# Patient Record
Sex: Female | Born: 1980
Health system: Southern US, Community
[De-identification: ages and names within clinical notes are randomized; demographics above are authoritative.]

## PROBLEM LIST (undated history)

## (undated) DIAGNOSIS — I10 Essential (primary) hypertension: Secondary | ICD-10-CM

## (undated) DIAGNOSIS — R87629 Unspecified abnormal cytological findings in specimens from vagina: Secondary | ICD-10-CM

## (undated) DIAGNOSIS — J45909 Unspecified asthma, uncomplicated: Secondary | ICD-10-CM

## (undated) DIAGNOSIS — D649 Anemia, unspecified: Secondary | ICD-10-CM

## (undated) DIAGNOSIS — D219 Benign neoplasm of connective and other soft tissue, unspecified: Secondary | ICD-10-CM

## (undated) HISTORY — PX: CERVICAL CONE BIOPSY: SUR198

## (undated) HISTORY — PX: TUBAL LIGATION: SHX77

## (undated) HISTORY — PX: CHOLECYSTECTOMY: SHX55

---

## 2014-11-27 ENCOUNTER — Encounter (HOSPITAL_COMMUNITY): Payer: Self-pay | Admitting: *Deleted

## 2014-11-27 ENCOUNTER — Inpatient Hospital Stay (HOSPITAL_COMMUNITY)
Admission: AD | Admit: 2014-11-27 | Discharge: 2014-11-27 | Disposition: A | Discharge status: Home / Self Care | Payer: Medicaid Other | Source: Ambulatory Visit | Attending: Obstetrics and Gynecology | Admitting: Obstetrics and Gynecology

## 2014-11-27 DIAGNOSIS — N920 Excessive and frequent menstruation with regular cycle: Secondary | ICD-10-CM

## 2014-11-27 DIAGNOSIS — Z3202 Encounter for pregnancy test, result negative: Secondary | ICD-10-CM | POA: Diagnosis not present

## 2014-11-27 DIAGNOSIS — N946 Dysmenorrhea, unspecified: Secondary | ICD-10-CM

## 2014-11-27 DIAGNOSIS — N939 Abnormal uterine and vaginal bleeding, unspecified: Secondary | ICD-10-CM | POA: Diagnosis present

## 2014-11-27 DIAGNOSIS — Z87891 Personal history of nicotine dependence: Secondary | ICD-10-CM | POA: Insufficient documentation

## 2014-11-27 HISTORY — DX: Benign neoplasm of connective and other soft tissue, unspecified: D21.9

## 2014-11-27 HISTORY — DX: Essential (primary) hypertension: I10

## 2014-11-27 HISTORY — DX: Unspecified abnormal cytological findings in specimens from vagina: R87.629

## 2014-11-27 HISTORY — DX: Unspecified asthma, uncomplicated: J45.909

## 2014-11-27 LAB — URINE MICROSCOPIC-ADD ON

## 2014-11-27 LAB — URINALYSIS, ROUTINE W REFLEX MICROSCOPIC
BILIRUBIN URINE: NEGATIVE
GLUCOSE, UA: NEGATIVE mg/dL
Ketones, ur: NEGATIVE mg/dL
Leukocytes, UA: NEGATIVE
Nitrite: NEGATIVE
PH: 7 (ref 5.0–8.0)
Protein, ur: 30 mg/dL — AB
SPECIFIC GRAVITY, URINE: 1.015 (ref 1.005–1.030)

## 2014-11-27 LAB — CBC
HEMATOCRIT: 27.1 % — AB (ref 36.0–46.0)
Hemoglobin: 8.2 g/dL — ABNORMAL LOW (ref 12.0–15.0)
MCH: 21 pg — AB (ref 26.0–34.0)
MCHC: 30.3 g/dL (ref 30.0–36.0)
MCV: 69.5 fL — AB (ref 78.0–100.0)
PLATELETS: 380 10*3/uL (ref 150–400)
RBC: 3.9 MIL/uL (ref 3.87–5.11)
RDW: 20 % — AB (ref 11.5–15.5)
WBC: 7.2 10*3/uL (ref 4.0–10.5)

## 2014-11-27 LAB — POCT PREGNANCY, URINE: Preg Test, Ur: NEGATIVE

## 2014-11-27 NOTE — MAU Note (Addendum)
Went to the Dr, he put her on BC,started it the first day of her period, first 3 days were normal.  Now has been bleeding for 8 days, 5 have been heavy , has been nauseated.  Got on birth control to the bleeding and pain. Now it is 10 ties worser.

## 2014-11-27 NOTE — MAU Provider Note (Signed)
History     CSN: [id number]  Arrival date and time: 11/27/14 1254   First Provider Initiated Contact with Patient 11/27/14 1355      Chief Complaint  Patient presents with  . Abdominal Pain  . Vaginal Bleeding   HPI Ms. Colleen Pham is a 34 y.o. [phone] who presents to MAU today with complaint of vaginal bleeding. Patient states that she was recently seen by Dr. Deatra Ina for management of heavy painful regular periods. She was put on OCPs. She started the OCPs on the first day of her cycle, she did not take her OCPs or BP meds today. She states that she has not been bleeding x 8 days. She continues to have moderate to heavy bleeding today with severe cramps. She states that she has used up to 6 pads and/or tampons today. She endorses nausea and headache as well. She took Ibuprofen recently with little relief. She is taking Iron Supplements. She has known fibroids.    OB History    Gravida Para Term Preterm AB TAB SAB Ectopic Multiple Living   3 3 2 1      3       Past Medical History  Diagnosis Date  . Hypertension   . Fibroid   . Asthma   . Vaginal Pap smear, abnormal     Past Surgical History  Procedure Laterality Date  . Cholecystectomy    . Cervical cone biopsy      History reviewed. No pertinent family history.  Social History  Substance Use Topics  . Smoking status: Former Research scientist (life sciences)  . Smokeless tobacco: None  . Alcohol Use: No    Allergies: No Known Allergies  Prescriptions prior to admission  Medication Sig Dispense Refill Last Dose  . albuterol (PROVENTIL HFA;VENTOLIN HFA) 108 (90 BASE) MCG/ACT inhaler Inhale 1-2 puffs into the lungs every 6 (six) hours as needed for wheezing or shortness of breath.   Past Week at Unknown time  . ibuprofen (ADVIL,MOTRIN) 200 MG tablet Take 200 mg by mouth every 6 (six) hours as needed for moderate pain.   11/27/2014 at Unknown time  . LINZESS 145 MCG CAPS capsule Take 145 mcg by mouth daily.  2 Past Week at Unknown time  .  lisinopril-hydrochlorothiazide (PRINZIDE,ZESTORETIC) 10-12.5 MG tablet Take 1 tablet by mouth daily.  5 11/26/2014 at Unknown time  . naproxen sodium (ALEVE) 220 MG tablet Take 440 mg by mouth daily as needed.   Past Week at Unknown time  . Olopatadine HCl (PAZEO) 0.7 % SOLN Apply 1 drop to eye daily.   Past Week at Unknown time    Review of Systems  Constitutional: Negative for fever and malaise/fatigue.  Gastrointestinal: Positive for nausea and abdominal pain. Negative for vomiting, diarrhea and constipation.  Genitourinary: Negative for dysuria, urgency and frequency.       + vaginal bleeding  Neurological: Positive for headaches.   Physical Exam   Blood pressure 143/92, pulse 88, temperature 98.2 F (36.8 C), temperature source Oral, resp. rate 20, height 5\' 2"  (1.575 m), weight 246 lb 3.2 oz (111.676 kg), last menstrual period 11/19/2014.  Physical Exam  Nursing note and vitals reviewed. Constitutional: She is oriented to person, place, and time. She appears well-developed and well-nourished. No distress.  HENT:  Head: Normocephalic and atraumatic.  Cardiovascular: Normal rate.   Respiratory: Effort normal.  GI: Soft. She exhibits no distension and no mass. There is no tenderness. There is no rebound and no guarding.  Genitourinary: Uterus is enlarged.  Uterus is not tender. Cervix exhibits no motion tenderness, no discharge and no friability. Right adnexum displays no mass and no tenderness. Left adnexum displays no mass and no tenderness. There is bleeding (small amount of blood in the vaginal vault) in the vagina. No vaginal discharge found.  Neurological: She is alert and oriented to person, place, and time.  Skin: Skin is warm and dry. No erythema.  Psychiatric: She has a normal mood and affect.    Results for orders placed or performed during the hospital encounter of 11/27/14 (from the past 24 hour(s))  Urinalysis, Routine w reflex microscopic (not at [facility])     Status:  Abnormal   Collection Time: 11/27/14  1:20 PM  Result Value Ref Range   Color, Urine YELLOW YELLOW   APPearance HAZY (A) CLEAR   Specific Gravity, Urine 1.015 1.005 - 1.030   pH 7.0 5.0 - 8.0   Glucose, UA NEGATIVE NEGATIVE mg/dL   Hgb urine dipstick LARGE (A) NEGATIVE   Bilirubin Urine NEGATIVE NEGATIVE   Ketones, ur NEGATIVE NEGATIVE mg/dL   Protein, ur 30 (A) NEGATIVE mg/dL   Nitrite NEGATIVE NEGATIVE   Leukocytes, UA NEGATIVE NEGATIVE  Urine microscopic-add on     Status: Abnormal   Collection Time: 11/27/14  1:20 PM  Result Value Ref Range   Squamous Epithelial / LPF 0-5 (A) NONE SEEN   WBC, UA 0-5 0 - 5 WBC/hpf   RBC / HPF TOO NUMEROUS TO COUNT 0 - 5 RBC/hpf   Bacteria, UA RARE (A) NONE SEEN  Pregnancy, urine POC     Status: None   Collection Time: 11/27/14  1:26 PM  Result Value Ref Range   Preg Test, Ur NEGATIVE NEGATIVE  CBC     Status: Abnormal   Collection Time: 11/27/14  1:35 PM  Result Value Ref Range   WBC 7.2 4.0 - 10.5 K/uL   RBC 3.90 3.87 - 5.11 MIL/uL   Hemoglobin 8.2 (L) 12.0 - 15.0 g/dL   HCT 27.1 (L) 36.0 - 46.0 %   MCV 69.5 (L) 78.0 - 100.0 fL   MCH 21.0 (L) 26.0 - 34.0 pg   MCHC 30.3 30.0 - 36.0 g/dL   RDW 20.0 (H) 11.5 - 15.5 %   Platelets 380 150 - 400 K/uL    MAU Course  Procedures None  MDM UPT - negative UA, CBC today  Discussed with Dr. Rogue Bussing. Continue OCPs, NSAIDs 800 mg q 8 hours scheduled and make appointment with Deatra Ina if continues Discussed at length with patient the plan for continuing OCPs with NSAIDs and follow-up in the office if bleeding continues. Patient eventually voiced understanding, but also stated that she did not want to keep taking the pills if they were going to make her bleed like this. I advised her that discontinuing OCPs would most likely prolong her bleeding and she should continue them at this time. Also encouraged her to take BP medications regularly while on OCPs to avoid elevated blood pressure. Assessment  and Plan  A: Menorrhagia Dysmenorrhea  P: Discharge home Continue OCPs, NSAIDs and Iron supplements Bleeding precautions discussed Patient advised to follow-up with Dr. Deatra Ina as scheduled or sooner if symptoms persist or worsen Patient may return to MAU as needed or if her condition were to change or worsen  Luvenia Redden, PA-C  11/27/2014, 2:04 PM

## 2014-11-27 NOTE — Discharge Instructions (Signed)
Dysmenorrhea °Menstrual cramps (dysmenorrhea) are caused by the muscles of the uterus tightening (contracting) during a menstrual period. For some women, this discomfort is merely bothersome. For others, dysmenorrhea can be severe enough to interfere with everyday activities for a few days each month. °Primary dysmenorrhea is menstrual cramps that last a couple of days when you start having menstrual periods or soon after. This often begins after a teenager starts having her period. As a woman gets older or has a baby, the cramps will usually lessen or disappear. Secondary dysmenorrhea begins later in life, lasts longer, and the pain may be stronger than primary dysmenorrhea. The pain may start before the period and last a few days after the period.  °CAUSES  °Dysmenorrhea is usually caused by an underlying problem, such as: °· The tissue lining the uterus grows outside of the uterus in other areas of the body (endometriosis). °· The endometrial tissue, which normally lines the uterus, is found in or grows into the muscular walls of the uterus (adenomyosis). °· The pelvic blood vessels are engorged with blood just before the menstrual period (pelvic congestive syndrome). °· Overgrowth of cells (polyps) in the lining of the uterus or cervix. °· Falling down of the uterus (prolapse) because of loose or stretched ligaments. °· Depression. °· Bladder problems, infection, or inflammation. °· Problems with the intestine, a tumor, or irritable bowel syndrome. °· Cancer of the female organs or bladder. °· A severely tipped uterus. °· A very tight opening or closed cervix. °· Noncancerous tumors of the uterus (fibroids). °· Pelvic inflammatory disease (PID). °· Pelvic scarring (adhesions) from a previous surgery. °· Ovarian cyst. °· An intrauterine device (IUD) used for birth control. °RISK FACTORS °You may be at greater risk of dysmenorrhea if: °· You are younger than age 30. °· You started puberty early. °· You have  irregular or heavy bleeding. °· You have never given birth. °· You have a family history of this problem. °· You are a smoker. °SIGNS AND SYMPTOMS  °· Cramping or throbbing pain in your lower abdomen. °· Headaches. °· Lower back pain. °· Nausea or vomiting. °· Diarrhea. °· Sweating or dizziness. °· Loose stools. °DIAGNOSIS  °A diagnosis is based on your history, symptoms, physical exam, diagnostic tests, or procedures. Diagnostic tests or procedures may include: °· Blood tests. °· Ultrasonography. °· An examination of the lining of the uterus (dilation and curettage, D&C). °· An examination inside your abdomen or pelvis with a scope (laparoscopy). °· X-rays. °· CT scan. °· MRI. °· An examination inside the bladder with a scope (cystoscopy). °· An examination inside the intestine or stomach with a scope (colonoscopy, gastroscopy). °TREATMENT  °Treatment depends on the cause of the dysmenorrhea. Treatment may include: °· Pain medicine prescribed by your health care provider. °· Birth control pills or an IUD with progesterone hormone in it. °· Hormone replacement therapy. °· Nonsteroidal anti-inflammatory drugs (NSAIDs). These may help stop the production of prostaglandins. °· Surgery to remove adhesions, endometriosis, ovarian cyst, or fibroids. °· Removal of the uterus (hysterectomy). °· Progesterone shots to stop the menstrual period. °· Cutting the nerves on the sacrum that go to the female organs (presacral neurectomy). °· Electric current to the sacral nerves (sacral nerve stimulation). °· Antidepressant medicine. °· Psychiatric therapy, counseling, or group therapy. °· Exercise and physical therapy. °· Meditation and yoga therapy. °· Acupuncture. °HOME CARE INSTRUCTIONS  °· Only take over-the-counter or prescription medicines as directed by your health care provider. °· Place a heating pad   or hot water bottle on your lower back or abdomen. Do not sleep with the heating pad.  Use aerobic exercises, walking,  swimming, biking, and other exercises to help lessen the cramping.  Massage to the lower back or abdomen may help.  Stop smoking.  Avoid alcohol and caffeine. SEEK MEDICAL CARE IF:   Your pain does not get better with medicine.  You have pain with sexual intercourse.  Your pain increases and is not controlled with medicines.  You have abnormal vaginal bleeding with your period.  You develop nausea or vomiting with your period that is not controlled with medicine. SEEK IMMEDIATE MEDICAL CARE IF:  You pass out.    This information is not intended to replace advice given to you by your health care provider. Make sure you discuss any questions you have with your health care provider.   Document Released: 12/26/2004 Document Revised: 08/28/2012 Document Reviewed: 06/13/2012 Elsevier Interactive Patient Education 2016 [employer] Menorrhagia Menorrhagia is when your menstrual periods are heavy or last longer than usual.  HOME CARE  Only take medicine as told by your doctor.  Take any iron pills as told by your doctor. Heavy bleeding may cause low levels of iron in your body.  Do not take aspirin 1 week before or during your period. Aspirin can make the bleeding worse.  Lie down for a while if you change your tampon or pad more than once in 2 hours. This may help lessen the bleeding.  Eat a healthy diet and foods with iron. These foods include leafy green vegetables, meat, liver, eggs, and whole grain breads and cereals.  Do not try to lose weight. Wait until the heavy bleeding has stopped and your iron level is normal. GET HELP IF:  You soak through a pad or tampon every 1 or 2 hours, and this happens every time you have a period.  You need to use pads and tampons at the same time because you are bleeding so much.  You need to change your pad or tampon during the night.  You have a period that lasts for more than 8 days.  You pass clots bigger than 1 inch (2.5 cm)  wide.  You have irregular periods that happen more or less often than once a month.  You feel dizzy or pass out (faint).  You feel very weak or tired.  You feel short of breath or feel your heart is beating too fast when you exercise.  You feel sick to your stomach (nausea) and you throw up (vomit) while you are taking your medicine.   You have watery poop (diarrhea) while you are taking your medicine.  You have any problems that may be related to the medicine you are taking.  GET HELP RIGHT AWAY IF:  You soak through 4 or more pads or tampons in 2 hours.  You have any bleeding while you are pregnant. MAKE SURE YOU:   Understand these instructions.  Will watch your condition.  Will get help right away if you are not doing well or get worse.   This information is not intended to replace advice given to you by your health care provider. Make sure you discuss any questions you have with your health care provider.   Document Released: 10/05/2007 Document Revised: 08/28/2012 Document Reviewed: 06/27/2012 Elsevier Interactive Patient Education [employer].

## 2014-12-12 ENCOUNTER — Emergency Department (HOSPITAL_COMMUNITY)
Admission: EM | Admit: 2014-12-12 | Discharge: 2014-12-12 | Disposition: A | Discharge status: Home / Self Care | Payer: No Typology Code available for payment source | Attending: Emergency Medicine | Admitting: Emergency Medicine

## 2014-12-12 ENCOUNTER — Encounter (HOSPITAL_COMMUNITY): Payer: Self-pay | Admitting: Emergency Medicine

## 2014-12-12 DIAGNOSIS — M542 Cervicalgia: Secondary | ICD-10-CM

## 2014-12-12 DIAGNOSIS — Y92410 Unspecified street and highway as the place of occurrence of the external cause: Secondary | ICD-10-CM | POA: Diagnosis not present

## 2014-12-12 DIAGNOSIS — I10 Essential (primary) hypertension: Secondary | ICD-10-CM | POA: Diagnosis not present

## 2014-12-12 DIAGNOSIS — J45909 Unspecified asthma, uncomplicated: Secondary | ICD-10-CM | POA: Insufficient documentation

## 2014-12-12 DIAGNOSIS — Z87891 Personal history of nicotine dependence: Secondary | ICD-10-CM | POA: Diagnosis not present

## 2014-12-12 DIAGNOSIS — Z86018 Personal history of other benign neoplasm: Secondary | ICD-10-CM | POA: Diagnosis not present

## 2014-12-12 DIAGNOSIS — S3992XA Unspecified injury of lower back, initial encounter: Secondary | ICD-10-CM | POA: Diagnosis not present

## 2014-12-12 DIAGNOSIS — M62830 Muscle spasm of back: Secondary | ICD-10-CM | POA: Diagnosis not present

## 2014-12-12 DIAGNOSIS — Y9389 Activity, other specified: Secondary | ICD-10-CM | POA: Insufficient documentation

## 2014-12-12 DIAGNOSIS — Z79899 Other long term (current) drug therapy: Secondary | ICD-10-CM | POA: Diagnosis not present

## 2014-12-12 DIAGNOSIS — Y998 Other external cause status: Secondary | ICD-10-CM | POA: Insufficient documentation

## 2014-12-12 DIAGNOSIS — S199XXA Unspecified injury of neck, initial encounter: Secondary | ICD-10-CM | POA: Diagnosis present

## 2014-12-12 MED ORDER — CYCLOBENZAPRINE HCL 10 MG PO TABS
5.0000 mg | ORAL_TABLET | Freq: Once | ORAL | Status: AC
  Administered 2014-12-12: 5 mg via ORAL
  Filled 2014-12-12: qty 1

## 2014-12-12 MED ORDER — IBUPROFEN 600 MG PO TABS: 600.0000 mg | ORAL_TABLET | Freq: Four times a day (QID) | ORAL | Status: DC | PRN

## 2014-12-12 MED ORDER — CYCLOBENZAPRINE HCL 10 MG PO TABS: 10.0000 mg | ORAL_TABLET | Freq: Every day | ORAL | Status: DC

## 2014-12-12 MED ORDER — IBUPROFEN 800 MG PO TABS
800.0000 mg | ORAL_TABLET | Freq: Once | ORAL | Status: AC
  Administered 2014-12-12: 800 mg via ORAL
  Filled 2014-12-12: qty 1
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
## 2014-12-12 NOTE — Discharge Instructions (Signed)
SEEK IMMEDIATE MEDICAL ATTENTION IF: °New numbness, tingling, weakness, or problem with the use of your arms or legs.  °Severe back pain not relieved with medications.  °Change in bowel or bladder control (if you lose control of stool or urine, or if you are unable to urinate) °Increasing pain in any areas of the body (such as chest or abdominal pain).  °Shortness of breath, dizziness or fainting.  °Nausea (feeling sick to your stomach), vomiting, fever, or sweats. ° °You have neck pain, possibly from a cervical strain and/or pinched nerve.  ° °SEEK IMMEDIATE MEDICAL ATTENTION IF: °You develop difficulties swallowing or breathing.  °You have new or worse numbness, weakness, tingling, or movement problems in your arms or legs.  °You develop increasing pain which is uncontrolled with medications.  °You have change in bowel or bladder function, or other concerns. ° °

## 2014-12-12 NOTE — ED Notes (Signed)
Patient was offered ibuprofen. She states that ibuprofen makes her nauseated. Patient was given crackers and water. Patient then complained there was not enough ice in the cup. Patient was given a fresh cup full of ice. Advised patient staff will come back with pain medication after she has had a chance to eat her crackers.
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
## 2014-12-17 ENCOUNTER — Emergency Department (HOSPITAL_COMMUNITY)
Admission: EM | Admit: 2014-12-17 | Discharge: 2014-12-17 | Disposition: A | Discharge status: Home / Self Care | Payer: No Typology Code available for payment source | Attending: Emergency Medicine | Admitting: Emergency Medicine

## 2014-12-17 ENCOUNTER — Emergency Department (HOSPITAL_COMMUNITY): Payer: No Typology Code available for payment source

## 2014-12-17 ENCOUNTER — Encounter (HOSPITAL_COMMUNITY): Payer: Self-pay

## 2014-12-17 DIAGNOSIS — M545 Low back pain: Secondary | ICD-10-CM | POA: Diagnosis not present

## 2014-12-17 DIAGNOSIS — A599 Trichomoniasis, unspecified: Secondary | ICD-10-CM | POA: Insufficient documentation

## 2014-12-17 DIAGNOSIS — J45909 Unspecified asthma, uncomplicated: Secondary | ICD-10-CM | POA: Diagnosis not present

## 2014-12-17 DIAGNOSIS — I10 Essential (primary) hypertension: Secondary | ICD-10-CM | POA: Insufficient documentation

## 2014-12-17 DIAGNOSIS — Z86018 Personal history of other benign neoplasm: Secondary | ICD-10-CM | POA: Insufficient documentation

## 2014-12-17 DIAGNOSIS — Z3202 Encounter for pregnancy test, result negative: Secondary | ICD-10-CM | POA: Diagnosis not present

## 2014-12-17 DIAGNOSIS — Z87891 Personal history of nicotine dependence: Secondary | ICD-10-CM | POA: Diagnosis not present

## 2014-12-17 DIAGNOSIS — M546 Pain in thoracic spine: Secondary | ICD-10-CM | POA: Diagnosis not present

## 2014-12-17 DIAGNOSIS — Z79899 Other long term (current) drug therapy: Secondary | ICD-10-CM | POA: Insufficient documentation

## 2014-12-17 LAB — URINALYSIS, ROUTINE W REFLEX MICROSCOPIC
BILIRUBIN URINE: NEGATIVE
GLUCOSE, UA: NEGATIVE mg/dL
HGB URINE DIPSTICK: NEGATIVE
KETONES UR: 15 mg/dL — AB
Nitrite: NEGATIVE
PROTEIN: NEGATIVE mg/dL
Specific Gravity, Urine: 1.025 (ref 1.005–1.030)
pH: 6 (ref 5.0–8.0)

## 2014-12-17 LAB — URINE MICROSCOPIC-ADD ON

## 2014-12-17 LAB — PREGNANCY, URINE: PREG TEST UR: NEGATIVE

## 2014-12-17 MED ORDER — METRONIDAZOLE 500 MG PO TABS: 500.0000 mg | ORAL_TABLET | Freq: Two times a day (BID) | ORAL | Status: DC

## 2014-12-17 MED ORDER — HYDROCODONE-ACETAMINOPHEN 5-325 MG PO TABS: 2.0000 | ORAL_TABLET | ORAL | Status: DC | PRN
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
## 2014-12-17 NOTE — ED Notes (Signed)
Pt tearful when RN entered room.  Pt did not want to talk about it but did state "she was going to give me something stronger for pain."  Informed Santiago Glad, PA-C.

## 2014-12-17 NOTE — Discharge Instructions (Signed)
Back Pain, Adult °Back pain is very common in adults. The cause of back pain is rarely dangerous and the pain often gets better over time. The cause of your back pain may not be known. Some common causes of back pain include: °· Strain of the muscles or ligaments supporting the spine. °· Wear and tear (degeneration) of the spinal disks. °· Arthritis. °· Direct injury to the back. °For many people, back pain may return. Since back pain is rarely dangerous, most people can learn to manage this condition on their own. °HOME CARE INSTRUCTIONS °Watch your back pain for any changes. The following actions may help to lessen any discomfort you are feeling: °· Remain active. It is stressful on your back to sit or stand in one place for long periods of time. Do not sit, drive, or stand in one place for more than 30 minutes at a time. Take short walks on even surfaces as soon as you are able. Try to increase the length of time you walk each day. °· Exercise regularly as directed by your health care provider. Exercise helps your back heal faster. It also helps avoid future injury by keeping your muscles strong and flexible. °· Do not stay in bed. Resting more than 1-2 days can delay your recovery. °· Pay attention to your body when you bend and lift. The most comfortable positions are those that put less stress on your recovering back. Always use proper lifting techniques, including: °¨ Bending your knees. °¨ Keeping the load close to your body. °¨ Avoiding twisting. °· Find a comfortable position to sleep. Use a firm mattress and lie on your side with your knees slightly bent. If you lie on your back, put a pillow under your knees. °· Avoid feeling anxious or stressed. Stress increases muscle tension and can worsen back pain. It is important to recognize when you are anxious or stressed and learn ways to manage it, such as with exercise. °· Take medicines only as directed by your health care provider. Over-the-counter  medicines to reduce pain and inflammation are often the most helpful. Your health care provider may prescribe muscle relaxant drugs. These medicines help dull your pain so you can more quickly return to your normal activities and healthy exercise. °· Apply ice to the injured area: °¨ Put ice in a plastic bag. °¨ Place a towel between your skin and the bag. °¨ Leave the ice on for 20 minutes, 2-3 times a day for the first 2-3 days. After that, ice and heat may be alternated to reduce pain and spasms. °· Maintain a healthy weight. Excess weight puts extra stress on your back and makes it difficult to maintain good posture. °SEEK MEDICAL CARE IF: °· You have pain that is not relieved with rest or medicine. °· You have increasing pain going down into the legs or buttocks. °· You have pain that does not improve in one week. °· You have night pain. °· You lose weight. °· You have a fever or chills. °SEEK IMMEDIATE MEDICAL CARE IF:  °· You develop new bowel or bladder control problems. °· You have unusual weakness or numbness in your arms or legs. °· You develop nausea or vomiting. °· You develop abdominal pain. °· You feel faint. °  °This information is not intended to replace advice given to you by your health care provider. Make sure you discuss any questions you have with your health care provider. °  °Document Released: 12/26/2004 Document Revised: 01/16/2014 Document Reviewed: 04/29/2013 °Elsevier Interactive Patient Education ©2016 [employer] Trichomonas Test The trichomonas test is done to diagnose trichomoniasis, an infection caused by an organism called Trichomonas. Trichomoniasis is a sexually transmitted infection (STI). In women, it causes vaginal infections. In men, it can cause the tube that carries urine (urethra) to become inflamed (urethritis). You may have this test as a part of a routine screening for STIs or if you have symptoms of trichomoniasis. To perform the test, your health care provider  will take a sample of discharge. The sample is taken from the vagina or cervix in women and from the urethra in men. A urine sample can also be used for testing. RESULTS It is your responsibility to obtain your test results. Ask the lab or department performing the test when and how you will get your results. Contact your health care provider to discuss any questions you have about your results.  Meaning of Negative Test Results A negative test means you do not have trichomoniasis. Follow your health care provider's directions about any follow-up testing.  Meaning of Positive Test Results A positive test result means you have an active infection that needs to be treated with antibiotic medicine. All your current sexual partners must also be treated or it is likely you will get reinfected.  If your test is positive, your health care provider will start you on medicine and may advise you to:  Not have sexual intercourse until your infection has cleared up.  Use a latex condom properly every time you have sexual intercourse.  Limit the number of sexual partners you have. The more partners you have, the greater your risk of contracting trichomoniasis or another STI.  Tell all sexual partners about your infection so they can also be treated and to prevent reinfection.   This information is not intended to replace advice given to you by your health care provider. Make sure you discuss any questions you have with your health care provider.   Document Released: 01/29/2004 Document Revised: 01/16/2014 Document Reviewed: 01/07/2013 Elsevier Interactive Patient Education [employer].
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
## 2014-12-17 NOTE — ED Notes (Signed)
Patient scanned using bladder scanner - 154 ml in bladder prior to urination, approximately 41 ml in bladder after urination.

## 2014-12-18 LAB — GC/CHLAMYDIA PROBE AMP (~~LOC~~) NOT AT ARMC
Chlamydia: NEGATIVE
NEISSERIA GONORRHEA: NEGATIVE

## 2017-03-16 ENCOUNTER — Encounter: Payer: Self-pay | Admitting: *Deleted

## 2017-04-18 ENCOUNTER — Encounter: Payer: Self-pay | Admitting: Obstetrics & Gynecology

## 2017-07-13 ENCOUNTER — Encounter: Payer: Medicaid Other | Admitting: Obstetrics & Gynecology

## 2017-07-18 ENCOUNTER — Emergency Department (HOSPITAL_COMMUNITY)
Admission: EM | Admit: 2017-07-18 | Discharge: 2017-07-18 | Disposition: A | Discharge status: Home / Self Care | Payer: No Typology Code available for payment source | Attending: Emergency Medicine | Admitting: Emergency Medicine

## 2017-07-18 ENCOUNTER — Emergency Department (HOSPITAL_COMMUNITY): Payer: No Typology Code available for payment source

## 2017-07-18 ENCOUNTER — Other Ambulatory Visit: Payer: Self-pay

## 2017-07-18 ENCOUNTER — Encounter (HOSPITAL_COMMUNITY): Payer: Self-pay

## 2017-07-18 DIAGNOSIS — Y9389 Activity, other specified: Secondary | ICD-10-CM | POA: Diagnosis not present

## 2017-07-18 DIAGNOSIS — M79661 Pain in right lower leg: Secondary | ICD-10-CM | POA: Diagnosis not present

## 2017-07-18 DIAGNOSIS — Z87891 Personal history of nicotine dependence: Secondary | ICD-10-CM | POA: Diagnosis not present

## 2017-07-18 DIAGNOSIS — M549 Dorsalgia, unspecified: Secondary | ICD-10-CM | POA: Diagnosis not present

## 2017-07-18 DIAGNOSIS — D252 Subserosal leiomyoma of uterus: Secondary | ICD-10-CM

## 2017-07-18 DIAGNOSIS — Z79899 Other long term (current) drug therapy: Secondary | ICD-10-CM | POA: Diagnosis not present

## 2017-07-18 DIAGNOSIS — Y998 Other external cause status: Secondary | ICD-10-CM | POA: Diagnosis not present

## 2017-07-18 DIAGNOSIS — Y92410 Unspecified street and highway as the place of occurrence of the external cause: Secondary | ICD-10-CM | POA: Diagnosis not present

## 2017-07-18 DIAGNOSIS — M542 Cervicalgia: Secondary | ICD-10-CM | POA: Insufficient documentation

## 2017-07-18 DIAGNOSIS — J45909 Unspecified asthma, uncomplicated: Secondary | ICD-10-CM | POA: Insufficient documentation

## 2017-07-18 DIAGNOSIS — M79662 Pain in left lower leg: Secondary | ICD-10-CM | POA: Diagnosis not present

## 2017-07-18 DIAGNOSIS — I10 Essential (primary) hypertension: Secondary | ICD-10-CM | POA: Diagnosis not present

## 2017-07-18 DIAGNOSIS — R51 Headache: Secondary | ICD-10-CM | POA: Insufficient documentation

## 2017-07-18 LAB — I-STAT BETA HCG BLOOD, ED (MC, WL, AP ONLY): I-stat hCG, quantitative: <5 m[IU]/mL (ref <5)

## 2017-07-18 LAB — I-STAT CHEM 8, ED
BUN: 8 mg/dL (ref 6–20)
Calcium, Ion: 1.15 mmol/L (ref 1.15–1.40)
Chloride: 105 mmol/L (ref 98–111)
Creatinine, Ser: 0.7 mg/dL (ref 0.44–1.00)
Glucose, Bld: 90 mg/dL (ref 70–99)
HCT: 27 % — ABNORMAL LOW (ref 36.0–46.0)
Hemoglobin: 9.2 g/dL — ABNORMAL LOW (ref 12.0–15.0)
Potassium: 3.7 mmol/L (ref 3.5–5.1)
Sodium: 141 mmol/L (ref 135–145)
TCO2: 24 mmol/L (ref 22–32)

## 2017-07-18 MED ORDER — IOPAMIDOL (ISOVUE-300) INJECTION 61%
INTRAVENOUS | Status: AC
  Filled 2017-07-18: qty 100

## 2017-07-18 MED ORDER — KETOROLAC TROMETHAMINE 30 MG/ML IJ SOLN
15.0000 mg | Freq: Once | INTRAMUSCULAR | Status: AC
  Administered 2017-07-18: 15 mg via INTRAVENOUS
  Filled 2017-07-18: qty 1

## 2017-07-18 MED ORDER — IOPAMIDOL (ISOVUE-300) INJECTION 61%
100.0000 mL | Freq: Once | INTRAVENOUS | Status: AC | PRN
  Administered 2017-07-18: 100 mL via INTRAVENOUS

## 2017-07-18 MED ORDER — IBUPROFEN 600 MG PO TABS: 600.0000 mg | ORAL_TABLET | Freq: Four times a day (QID) | ORAL | 0 refills | Status: DC | PRN

## 2017-07-18 MED ORDER — METHOCARBAMOL 500 MG PO TABS: 500.0000 mg | ORAL_TABLET | Freq: Two times a day (BID) | ORAL | 0 refills | Status: DC

## 2017-07-18 MED ORDER — ACETAMINOPHEN 500 MG PO TABS: 500.0000 mg | ORAL_TABLET | Freq: Four times a day (QID) | ORAL | 0 refills | Status: DC | PRN

## 2017-07-18 NOTE — ED Notes (Signed)
ED Provider at bedside. 
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
## 2017-07-18 NOTE — ED Notes (Signed)
Patient ambulatory to CT
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
## 2017-11-19 ENCOUNTER — Encounter: Payer: Self-pay | Admitting: *Deleted

## 2017-11-19 ENCOUNTER — Ambulatory Visit: Payer: Self-pay | Admitting: Family Medicine

## 2017-11-19 NOTE — Progress Notes (Signed)
Colleen Pham did not keep her scheduled appointment for follow up. Per discussion with Dr.Stinson does not need to be called, may reschedule if she calls.

## 2018-05-22 ENCOUNTER — Encounter: Payer: Self-pay | Admitting: Radiology

## 2018-05-28 ENCOUNTER — Other Ambulatory Visit: Payer: Self-pay

## 2018-05-28 ENCOUNTER — Encounter: Payer: Self-pay | Admitting: Obstetrics and Gynecology

## 2018-05-28 ENCOUNTER — Ambulatory Visit (INDEPENDENT_AMBULATORY_CARE_PROVIDER_SITE_OTHER): Payer: Medicaid Other | Admitting: Obstetrics and Gynecology

## 2018-05-28 VITALS — BP 140/86 | HR 72 | Wt 267.4 lb

## 2018-05-28 DIAGNOSIS — N920 Excessive and frequent menstruation with regular cycle: Secondary | ICD-10-CM | POA: Diagnosis not present

## 2018-05-28 DIAGNOSIS — D219 Benign neoplasm of connective and other soft tissue, unspecified: Secondary | ICD-10-CM

## 2018-05-28 NOTE — Progress Notes (Signed)
Referral to discuss fibroids Tubal ligation 2005
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
## 2018-05-29 LAB — ANEMIA PROFILE B
Basophils Absolute: 0 10*3/uL (ref 0.0–0.2)
Basos: 0 %
EOS (ABSOLUTE): 0.2 10*3/uL (ref 0.0–0.4)
Eos: 3 %
Ferritin: 6 ng/mL — ABNORMAL LOW (ref 15–150)
Folate: 10.5 ng/mL (ref >3.0)
Hematocrit: 25.8 % — ABNORMAL LOW (ref 34.0–46.6)
Hemoglobin: 7.1 g/dL — ABNORMAL LOW (ref 11.1–15.9)
Immature Grans (Abs): 0 10*3/uL (ref 0.0–0.1)
Immature Granulocytes: 1 %
Iron Saturation: 5 % — CL (ref 15–55)
Iron: 22 ug/dL — ABNORMAL LOW (ref 27–159)
Lymphocytes Absolute: 1.6 10*3/uL (ref 0.7–3.1)
Lymphs: 22 %
MCH: 17.6 pg — ABNORMAL LOW (ref 26.6–33.0)
MCHC: 27.5 g/dL — ABNORMAL LOW (ref 31.5–35.7)
MCV: 64 fL — ABNORMAL LOW (ref 79–97)
Monocytes Absolute: 0.4 10*3/uL (ref 0.1–0.9)
Monocytes: 6 %
Neutrophils Absolute: 4.8 10*3/uL (ref 1.4–7.0)
Neutrophils: 68 %
Platelets: 377 10*3/uL (ref 150–450)
RBC: 4.04 x10E6/uL (ref 3.77–5.28)
RDW: 21.8 % — ABNORMAL HIGH (ref 11.7–15.4)
Retic Ct Pct: 2.8 % — ABNORMAL HIGH (ref 0.6–2.6)
Total Iron Binding Capacity: 413 ug/dL (ref 250–450)
UIBC: 391 ug/dL (ref 131–425)
Vitamin B-12: 406 pg/mL (ref 232–1245)
WBC: 7.1 10*3/uL (ref 3.4–10.8)

## 2018-05-29 LAB — TSH: TSH: 1.36 u[IU]/mL (ref 0.450–4.500)

## 2018-06-04 ENCOUNTER — Telehealth: Payer: Self-pay | Admitting: *Deleted

## 2018-06-04 MED ORDER — POLYETHYLENE GLYCOL 3350 17 G PO PACK: 17.0000 g | PACK | Freq: Every day | ORAL | 0 refills | Status: AC

## 2018-06-04 MED ORDER — FERROUS GLUCONATE 324 (37.5 FE) MG PO TABS: 1.0000 | ORAL_TABLET | Freq: Two times a day (BID) | ORAL | 0 refills | Status: DC

## 2018-06-04 NOTE — Telephone Encounter (Signed)
Pt informed of results and meds sent in.

## 2018-06-04 NOTE — Telephone Encounter (Signed)
-----   Message from Aletha Halim, MD sent at 06/04/2018  2:23 PM EDT ----- Can you let her know that I've seen in iron for her and stool softener? thanks

## 2018-06-04 NOTE — Addendum Note (Signed)
Addended by: Aletha Halim on: 06/04/2018 02:23 PM   Modules accepted: Orders

## 2018-06-27 ENCOUNTER — Ambulatory Visit (HOSPITAL_COMMUNITY): Payer: Medicaid Other

## 2018-07-04 ENCOUNTER — Ambulatory Visit: Payer: Medicaid Other | Admitting: Obstetrics and Gynecology

## 2018-07-08 ENCOUNTER — Ambulatory Visit (HOSPITAL_COMMUNITY)
Admission: RE | Admit: 2018-07-08 | Discharge: 2018-07-08 | Disposition: A | Discharge status: Home / Self Care | Payer: Medicaid Other | Source: Ambulatory Visit | Attending: Obstetrics and Gynecology | Admitting: Obstetrics and Gynecology

## 2018-07-08 ENCOUNTER — Other Ambulatory Visit: Payer: Self-pay

## 2018-07-08 DIAGNOSIS — N920 Excessive and frequent menstruation with regular cycle: Secondary | ICD-10-CM | POA: Insufficient documentation

## 2018-07-15 ENCOUNTER — Ambulatory Visit: Payer: Medicaid Other | Admitting: Obstetrics and Gynecology

## 2018-07-15 ENCOUNTER — Telehealth (INDEPENDENT_AMBULATORY_CARE_PROVIDER_SITE_OTHER): Payer: Medicaid Other | Admitting: Obstetrics and Gynecology

## 2018-07-15 ENCOUNTER — Encounter: Payer: Self-pay | Admitting: Obstetrics and Gynecology

## 2018-07-15 ENCOUNTER — Other Ambulatory Visit: Payer: Self-pay

## 2018-07-15 DIAGNOSIS — Z9851 Tubal ligation status: Secondary | ICD-10-CM | POA: Insufficient documentation

## 2018-07-15 DIAGNOSIS — N920 Excessive and frequent menstruation with regular cycle: Secondary | ICD-10-CM

## 2018-07-15 DIAGNOSIS — D219 Benign neoplasm of connective and other soft tissue, unspecified: Secondary | ICD-10-CM | POA: Diagnosis not present

## 2018-07-15 DIAGNOSIS — D500 Iron deficiency anemia secondary to blood loss (chronic): Secondary | ICD-10-CM | POA: Diagnosis not present

## 2018-07-15 DIAGNOSIS — D649 Anemia, unspecified: Secondary | ICD-10-CM | POA: Insufficient documentation

## 2018-07-15 NOTE — Progress Notes (Signed)
I connected with  Colleen Pham on 07/15/18 at  3:15 PM EDT by telephone and verified that I am speaking with the correct person using two identifiers.   I discussed the limitations, risks, security and privacy concerns of performing an evaluation and management service by telephone and the availability of in person appointments. I also discussed with the patient that there may be a patient responsible charge related to this service. The patient expressed understanding and agreed to proceed.  [place], [place] 07/15/2018  3:12 PM

## 2018-07-15 NOTE — Progress Notes (Signed)
TELEHEALTH VIRTUAL GYNECOLOGY VISIT ENCOUNTER NOTE  I connected with Yevette Edwards on 07/15/18 at  3:15 PM EDT by telephone at home and verified that I am speaking with the correct person using two identifiers.   I discussed the limitations, risks, security and privacy concerns of performing an evaluation and management service by telephone and the availability of in person appointments. I also discussed with the patient that there may be a patient responsible charge related to this service. The patient expressed understanding and agreed to proceed.  Chief Complaint: ultrasound follow up  History:  Colleen Pham is a 38 y.o. [phone] with above CC. Her past medical history is significant for h/o BTL (failed), fibroids, bmi 49, HTN  Patient seen by me for NPE on 5/19 from her pcp for fibroids, anemia. Ultrasound ordered and results below and with normal tsh but anemia panel showed iron deficiency anemia with Hgb of 7.1  No current bleeding, pain, s/s of anemia   Review of Systems:  Pertinent items noted in HPI and remainder of comprehensive ROS otherwise negative.   Past Medical History:  Diagnosis Date  . Asthma   . Fibroid   . Hypertension   . Vaginal Pap smear, abnormal    Past Surgical History:  Procedure Laterality Date  . CERVICAL CONE BIOPSY    . CHOLECYSTECTOMY    . TUBAL LIGATION     OB History    Gravida  3   Para  3   Term  2   Preterm  1   AB      Living  3     SAB      TAB      Ectopic      Multiple      Live Births           Obstetric Comments  Vaginal x 3.        The following portions of the patient's history were reviewed and updated as appropriate: allergies, current medications, past family history, past medical history, past social history, past surgical history and problem list.   Past Gynecological History: As per HPI. Menarche age 8-13 y/o Periods: qmonth, 8-9 day, no intermenstrual bleeding History of Pap Smear(s): Yes.    Last pap 09/2015, which was negative History of STI(s): Yes.   She is currently using no method for contraception.  Patient states she's tried ocps and depo provera in the past but only had breakthrough bleeding with it.Marland Kitchen   Physical Exam:   General:  Alert, oriented and cooperative.   Mental Status: Normal mood and affect perceived. Normal judgment and thought content.  Physical exam deferred due to nature of the encounter  Labs and Imaging CBC Latest Ref Rng & Units 05/28/2018 07/18/2017 11/27/2014  WBC 3.4 - 10.8 x10E3/uL 7.1 - 7.2  Hemoglobin 11.1 - 15.9 g/dL 7.1(L) 9.2(L) 8.2(L)  Hematocrit 34.0 - 46.6 % 25.8(L) 27.0(L) 27.1(L)  Platelets 150 - 450 x10E3/uL 377 - 380   CLINICAL DATA:  Menorrhagia.  History of fibroids.  EXAM: TRANSABDOMINAL AND TRANSVAGINAL ULTRASOUND OF PELVIS  TECHNIQUE: Both transabdominal and transvaginal ultrasound examinations of the pelvis were performed. Transabdominal technique was performed for global imaging of the pelvis including uterus, ovaries, adnexal regions, and pelvic cul-de-sac. It was necessary to proceed with endovaginal exam following the transabdominal exam to visualize the endometrium.  COMPARISON:  None  FINDINGS: Uterus  Measurements: 12.7 x 7.0 x 8.6 cm = volume: 395.7 mL. Multiple large fibroids are identified. Dominant exophytic  fibroid (excluded from the above measurements) arises from the fundus and appears subserosal measuring 14.4 x 9.2 x 12.4 cm. Within the left posterior fundus there is a fibroid measuring 6 x 5.6 x 3.8 cm. Two smaller fibroids are noted within the anterior lower uterine segment measuring up to 3.9 x 3.6 x 3.6 cm  Endometrium  Thickness: 12.8 mm.  No focal abnormality visualized.  Right ovary  Measurements: 3.46 x 2.0 x 3.1 cm = volume: 11.8 ML. Normal appearance/no adnexal mass.  Left ovary  Measurements: 4.3 x 2.0 x 3.1 cm = volume: 14.6 mL. Normal appearance/no adnexal mass.   Other findings  No abnormal free fluid.  IMPRESSION: 1. Enlarged fibroid uterus is again noted similar to CT from 07/18/17. Dominant exophytic, subserosal fibroid arising from the fundus measures 14.4 x 9.2 x 12.4 cm. 2. Mild bilateral ovarian enlargement measuring up to 14.6 cm. Correlate for any clinical signs or symptoms of polycystic ovary syndrome.   Electronically Signed   By: Kerby Moors M.D.   On: 07/08/2018 16:48    Assessment and Plan:    Patient stable  1. Failed BTL See below  2. Iron deficiency anemia due to chronic blood loss See below. Pt confirms on bidac iron. May need iron transfusion if rpt. I also discussed with her possibility of blood transfusion in the future course of her treatment, pt states she would amenable to this.   3. Fibroids See below  4. Menorrhagia with regular cycle Long d/w her re: this and potential options. I told her she's very limited in terms of medical options since she's already tried OCPs and depo provera, I don't forsee progestin only pills working in her situation.  I told her that her only real option is something surgical with her two options being hysterectomy vs. Myomectomy. I told her I'd only recommend a myomectomy if she is strongly considering more children; pt has a failed btl currently. She states that she is done with child bearing, so I told her I would recommend a hysterectomy b/c a myomectomy has no guarantee of fixing her bleeding issues and myomectomy can be more morbid then a hysterectomy and the sequale from healing can make subsequent surgeries more difficult.   I told her the best case scenario would be to do 31m of depot lupron and schedule her hyst 2 1/2 months from the injection to hopefully shrink the fibroids and make the surgery safer and would hopefully give her body time to increase her H/H and likely avoid need for a transfusion. I told her about the risks of depot lupron, side effects and she is amenable  to the trial. I will have the clinic contact her insurance to see if she can get it. If not, I told her she'll need a blood transfusion prior to surgery and admission pre op for this.   I also d/w her re: the filshie clip in her abdomen and that if it is easily removable I would recommend taking it out. I called [facility] re: this and they will get back to me to see what is the best way to have one of them on standby in case they are needed, fluoro etc.    I discussed the assessment and treatment plan with the patient. The patient was provided an opportunity to ask questions and all were answered. The patient agreed with the plan and demonstrated an understanding of the instructions.   The patient was advised to call back or seek an  in-person evaluation/go to the ED if the symptoms worsen or if the condition fails to improve as anticipated.  I provided 42minutes of non-face-to-face time during this encounter. The visit was done via a MyChart Video visit.    Aletha Halim, MD Center for [place]

## 2018-07-16 ENCOUNTER — Other Ambulatory Visit: Payer: Self-pay | Admitting: *Deleted

## 2018-07-16 MED ORDER — LUPRON DEPOT (3-MONTH) 11.25 MG IM KIT: 11.2500 mg | PACK | INTRAMUSCULAR | 0 refills | Status: DC

## 2018-07-17 ENCOUNTER — Encounter: Payer: Self-pay | Admitting: *Deleted

## 2018-07-25 ENCOUNTER — Other Ambulatory Visit: Payer: Self-pay

## 2018-07-25 ENCOUNTER — Ambulatory Visit (INDEPENDENT_AMBULATORY_CARE_PROVIDER_SITE_OTHER): Payer: Medicaid Other | Admitting: Obstetrics & Gynecology

## 2018-07-25 VITALS — BP 141/82

## 2018-07-25 DIAGNOSIS — D219 Benign neoplasm of connective and other soft tissue, unspecified: Secondary | ICD-10-CM | POA: Diagnosis not present

## 2018-07-25 MED ORDER — LEUPROLIDE ACETATE (3 MONTH) 11.25 MG IM KIT
11.2500 mg | PACK | Freq: Once | INTRAMUSCULAR | Status: AC
  Administered 2018-07-25: 11.25 mg via INTRAMUSCULAR

## 2018-07-25 NOTE — Progress Notes (Signed)
Patient presented to the office for her depo lupron injection. 11.25mg  given in the left upper outer quadent area. Patient tolerated well and would like to follow up in three month for another dose. She reports she would like to wait before scheduling an hysterectomy to see if she gets any relief from lupron. Advised patient I will let Dr.Pickens know what her plan will be and will give her a call once DR.Ilda Basset has advise [place] to do so.  Patient voice understanding.

## 2018-07-25 NOTE — Progress Notes (Signed)
Patient seen and assessed by nursing staff during this encounter. I have reviewed the chart and agree with the documentation and plan.  Verita Schneiders, MD 07/25/2018 6:55 PM

## 2018-08-19 ENCOUNTER — Other Ambulatory Visit: Payer: Self-pay | Admitting: Obstetrics and Gynecology

## 2018-08-19 DIAGNOSIS — D500 Iron deficiency anemia secondary to blood loss (chronic): Secondary | ICD-10-CM

## 2018-08-20 ENCOUNTER — Telehealth: Payer: Self-pay | Admitting: *Deleted

## 2018-08-20 NOTE — Telephone Encounter (Signed)
Left message for pt to call and make a lab appointment

## 2018-08-20 NOTE — Telephone Encounter (Signed)
Left message for pt to call and schedule a lab appointment

## 2018-08-22 ENCOUNTER — Other Ambulatory Visit: Payer: Self-pay

## 2018-08-22 ENCOUNTER — Other Ambulatory Visit: Payer: Medicaid Other

## 2018-08-22 DIAGNOSIS — D500 Iron deficiency anemia secondary to blood loss (chronic): Secondary | ICD-10-CM

## 2018-08-23 LAB — ANEMIA PROFILE B
Basophils Absolute: 0 10*3/uL (ref 0.0–0.2)
Basos: 0 %
EOS (ABSOLUTE): 0.3 10*3/uL (ref 0.0–0.4)
Eos: 4 %
Ferritin: 5 ng/mL — ABNORMAL LOW (ref 15–150)
Folate: 8.5 ng/mL (ref >3.0)
Hematocrit: 28.4 % — ABNORMAL LOW (ref 34.0–46.6)
Hemoglobin: 7.5 g/dL — ABNORMAL LOW (ref 11.1–15.9)
Immature Grans (Abs): 0 10*3/uL (ref 0.0–0.1)
Immature Granulocytes: 0 %
Iron Saturation: 4 % — CL (ref 15–55)
Iron: 18 ug/dL — ABNORMAL LOW (ref 27–159)
Lymphocytes Absolute: 1.9 10*3/uL (ref 0.7–3.1)
Lymphs: 28 %
MCH: 17.3 pg — ABNORMAL LOW (ref 26.6–33.0)
MCHC: 26.4 g/dL — ABNORMAL LOW (ref 31.5–35.7)
MCV: 66 fL — ABNORMAL LOW (ref 79–97)
Monocytes Absolute: 0.4 10*3/uL (ref 0.1–0.9)
Monocytes: 6 %
Neutrophils Absolute: 4 10*3/uL (ref 1.4–7.0)
Neutrophils: 62 %
Platelets: 339 10*3/uL (ref 150–450)
RBC: 4.33 x10E6/uL (ref 3.77–5.28)
RDW: 21.1 % — ABNORMAL HIGH (ref 11.7–15.4)
Retic Ct Pct: 2 % (ref 0.6–2.6)
Total Iron Binding Capacity: 425 ug/dL (ref 250–450)
UIBC: 407 ug/dL (ref 131–425)
Vitamin B-12: 477 pg/mL (ref 232–1245)
WBC: 6.5 10*3/uL (ref 3.4–10.8)

## 2018-08-27 NOTE — Addendum Note (Signed)
Addended by: Aletha Halim on: 08/27/2018 04:10 PM   Modules accepted: Orders

## 2018-08-28 ENCOUNTER — Telehealth: Payer: Self-pay | Admitting: *Deleted

## 2018-08-28 NOTE — Telephone Encounter (Signed)
Left message for pt to call in regards to her results and future appointment

## 2018-08-28 NOTE — Telephone Encounter (Signed)
-----   Message from Aletha Halim, MD sent at 08/27/2018  4:10 PM EDT ----- Please set her up for an asap feraheme transfusion. Orders are in. Thanks

## 2018-08-28 NOTE — Telephone Encounter (Signed)
Pt informed of results and needing Iron infusion. Pt scheduled on Monday 8/24 at 10am at the [facility].

## 2018-09-02 ENCOUNTER — Encounter (HOSPITAL_COMMUNITY): Payer: Medicaid Other

## 2018-09-06 ENCOUNTER — Encounter (HOSPITAL_COMMUNITY): Payer: Medicaid Other

## 2018-09-09 ENCOUNTER — Ambulatory Visit (HOSPITAL_COMMUNITY)
Admission: RE | Admit: 2018-09-09 | Discharge: 2018-09-09 | Disposition: A | Discharge status: Home / Self Care | Payer: Medicaid Other | Source: Ambulatory Visit | Attending: Internal Medicine | Admitting: Internal Medicine

## 2018-09-09 ENCOUNTER — Other Ambulatory Visit: Payer: Self-pay

## 2018-09-09 DIAGNOSIS — D500 Iron deficiency anemia secondary to blood loss (chronic): Secondary | ICD-10-CM | POA: Diagnosis not present

## 2018-09-09 MED ORDER — SODIUM CHLORIDE 0.9 % IV SOLN
INTRAVENOUS | Status: DC | PRN
  Administered 2018-09-09: 11:00:00 250 mL via INTRAVENOUS

## 2018-09-09 MED ORDER — SODIUM CHLORIDE 0.9 % IV SOLN
510.0000 mg | INTRAVENOUS | Status: DC
  Administered 2018-09-09: 510 mg via INTRAVENOUS
  Filled 2018-09-09: qty 17

## 2018-09-09 NOTE — Discharge Instructions (Signed)

## 2018-09-09 NOTE — Progress Notes (Signed)
Patient received Feraheme via PIV. Pre infusion BP was 154/85. Aletha Halim MD's office was notified. Spoke with Ernesto Rutherford. Per MD, RN can proceed with the infusion.  Patient observed for at least 30 minutes post infusion. Re-checked BP was 153/81. Tolerated well, vitals stable, discharge instructions given, verbalized understanding. Patient alert, oriented and ambulatory at the time of discharge.

## 2018-09-23 ENCOUNTER — Encounter: Payer: Self-pay | Admitting: *Deleted

## 2018-09-23 ENCOUNTER — Other Ambulatory Visit: Payer: Self-pay | Admitting: Obstetrics and Gynecology

## 2018-09-23 DIAGNOSIS — D500 Iron deficiency anemia secondary to blood loss (chronic): Secondary | ICD-10-CM

## 2018-09-24 ENCOUNTER — Other Ambulatory Visit: Payer: Medicaid Other

## 2018-09-24 ENCOUNTER — Other Ambulatory Visit: Payer: Self-pay

## 2018-09-24 ENCOUNTER — Encounter: Payer: Self-pay | Admitting: *Deleted

## 2018-09-24 DIAGNOSIS — D500 Iron deficiency anemia secondary to blood loss (chronic): Secondary | ICD-10-CM

## 2018-09-24 LAB — CBC
Hematocrit: 32.2 % — ABNORMAL LOW (ref 34.0–46.6)
Hemoglobin: 8.9 g/dL — ABNORMAL LOW (ref 11.1–15.9)
MCH: 20 pg — ABNORMAL LOW (ref 26.6–33.0)
MCHC: 27.6 g/dL — ABNORMAL LOW (ref 31.5–35.7)
MCV: 72 fL — ABNORMAL LOW (ref 79–97)
Platelets: 277 10*3/uL (ref 150–450)
RBC: 4.45 x10E6/uL (ref 3.77–5.28)
RDW: 28.4 % — ABNORMAL HIGH (ref 11.7–15.4)
WBC: 5.5 10*3/uL (ref 3.4–10.8)

## 2018-09-26 ENCOUNTER — Other Ambulatory Visit: Payer: Self-pay | Admitting: Obstetrics and Gynecology

## 2018-09-26 MED ORDER — SODIUM CHLORIDE 0.9% IV SOLUTION: Freq: Once | INTRAVENOUS | Status: DC

## 2018-09-27 NOTE — Progress Notes (Signed)
[facility] DR[facility] [place], [address] AT [address] [place] [address] Phone: [phone] Fax: [phone]    Your procedure is scheduled on Tuesday, September 29th.  Report to [facility] Main Entrance "A" at 8:45 A.M., and check in at the Admitting office.  Call this number if you have problems the morning of surgery:  [phone]  Call [phone] if you have any questions prior to your surgery date Monday-Friday 8am-4pm   Remember:  Do not eat after midnight the night before your surgery  You may drink clear liquids until 7:45 the morning of your surgery.   Clear liquids allowed are: Water, Non-Citrus Juices (without pulp), Carbonated Beverages, Clear Tea, Black Coffee Only, and Gatorade    Take these medicines the morning of surgery with A SIP OF WATER: NONE  7 days prior to surgery STOP taking any Aspirin (unless otherwise instructed by your surgeon), Aleve, Naproxen, Ibuprofen, Motrin, Advil, Goody's, BC's, all herbal medications, fish oil, and all vitamins.   The Morning of Surgery  Do not wear jewelry, make-up or nail polish.  Do not wear lotions, powders, or perfumes/colognes, or deodorant  Do not shave 48 hours prior to surgery.    Do not bring valuables to the hospital.  [facility] is not responsible for any belongings or valuables.  If you are a smoker, DO NOT Smoke 24 hours prior to surgery IF you wear a CPAP at night please bring your mask, tubing, and machine the morning of surgery   Remember that you must have someone to transport you home after your surgery, and remain with you for 24 hours if you are discharged the same day.  Contacts, glasses, hearing aids, dentures or bridgework may not be worn into surgery.   Leave your suitcase in the car.  After surgery it may be brought to your room.  For patients admitted to the hospital, discharge time will be determined by your treatment  team.  Patients discharged the day of surgery will not be allowed to drive home.   Special instructions:   [place]- Preparing For Surgery  Before surgery, you can play an important role. Because skin is not sterile, your skin needs to be as free of germs as possible. You can reduce the number of germs on your skin by washing with CHG (chlorahexidine gluconate) Soap before surgery.  CHG is an antiseptic cleaner which kills germs and bonds with the skin to continue killing germs even after washing.    Oral Hygiene is also important to reduce your risk of infection.  Remember - BRUSH YOUR TEETH THE MORNING OF SURGERY WITH YOUR REGULAR TOOTHPASTE  Please do not use if you have an allergy to CHG or antibacterial soaps. If your skin becomes reddened/irritated stop using the CHG.  Do not shave (including legs and underarms) for at least 48 hours prior to first CHG shower. It is OK to shave your face.  Please follow these instructions carefully.   1. Shower the NIGHT BEFORE SURGERY and the MORNING OF SURGERY with CHG Soap.   2. If you chose to wash your hair, wash your hair first as usual with your normal shampoo.  3. After you shampoo, rinse your hair and body thoroughly to remove the shampoo.  4. Use CHG as you would any other liquid soap. You can apply CHG directly to the skin and wash gently with a scrungie or a clean washcloth.  5. Apply the CHG Soap to your body ONLY FROM THE NECK DOWN.  Do not use on open wounds or open sores. Avoid contact with your eyes, ears, mouth and genitals (private parts). Wash Face and genitals (private parts)  with your normal soap.   6. Wash thoroughly, paying special attention to the area where your surgery will be performed.  7. Thoroughly rinse your body with warm water from the neck down.  8. DO NOT shower/wash with your normal soap after using and rinsing off the CHG Soap.  9. Pat yourself dry with a CLEAN TOWEL.  10. Wear CLEAN PAJAMAS to bed  the night before surgery, wear comfortable clothes the morning of surgery  11. Place CLEAN SHEETS on your bed the night of your first shower and DO NOT SLEEP WITH PETS.  Day of Surgery: Do not apply any deodorants/lotions. Please shower the morning of surgery with the CHG soap  Please wear clean clothes to the hospital/surgery center.   Remember to brush your teeth WITH YOUR REGULAR TOOTHPASTE.  Please read over the following fact sheets that you were given.

## 2018-09-30 ENCOUNTER — Other Ambulatory Visit (HOSPITAL_COMMUNITY): Payer: Medicaid Other

## 2018-09-30 ENCOUNTER — Encounter (HOSPITAL_COMMUNITY): Payer: Self-pay

## 2018-09-30 ENCOUNTER — Encounter (HOSPITAL_COMMUNITY)
Admission: RE | Admit: 2018-09-30 | Discharge: 2018-09-30 | Disposition: A | Discharge status: Home / Self Care | Payer: Medicaid Other | Source: Ambulatory Visit | Attending: Obstetrics and Gynecology | Admitting: Obstetrics and Gynecology

## 2018-09-30 ENCOUNTER — Other Ambulatory Visit: Payer: Self-pay

## 2018-09-30 DIAGNOSIS — I10 Essential (primary) hypertension: Secondary | ICD-10-CM | POA: Insufficient documentation

## 2018-09-30 DIAGNOSIS — Z01818 Encounter for other preprocedural examination: Secondary | ICD-10-CM | POA: Diagnosis not present

## 2018-09-30 HISTORY — DX: Anemia, unspecified: D64.9

## 2018-09-30 LAB — CBC
HCT: 35 % — ABNORMAL LOW (ref 36.0–46.0)
Hemoglobin: 9.3 g/dL — ABNORMAL LOW (ref 12.0–15.0)
MCH: 20.6 pg — ABNORMAL LOW (ref 26.0–34.0)
MCHC: 26.6 g/dL — ABNORMAL LOW (ref 30.0–36.0)
MCV: 77.4 fL — ABNORMAL LOW (ref 80.0–100.0)
Platelets: 353 K/uL (ref 150–400)
RBC: 4.52 MIL/uL (ref 3.87–5.11)
RDW: 31 % — ABNORMAL HIGH (ref 11.5–15.5)
WBC: 7.4 K/uL (ref 4.0–10.5)
nRBC: 0 % (ref 0.0–0.2)

## 2018-09-30 LAB — COMPREHENSIVE METABOLIC PANEL
ALT: 13 U/L (ref 0–44)
AST: 14 U/L — ABNORMAL LOW (ref 15–41)
Albumin: 3.5 g/dL (ref 3.5–5.0)
Alkaline Phosphatase: 64 U/L (ref 38–126)
Anion gap: 7 (ref 5–15)
BUN: 13 mg/dL (ref 6–20)
CO2: 24 mmol/L (ref 22–32)
Calcium: 8.9 mg/dL (ref 8.9–10.3)
Chloride: 109 mmol/L (ref 98–111)
Creatinine, Ser: 0.97 mg/dL (ref 0.44–1.00)
GFR calc Af Amer: >60 mL/min (ref >60)
GFR calc non Af Amer: >60 mL/min (ref >60)
Glucose, Bld: 90 mg/dL (ref 70–99)
Potassium: 4 mmol/L (ref 3.5–5.1)
Sodium: 140 mmol/L (ref 135–145)
Total Bilirubin: 0.5 mg/dL (ref 0.3–1.2)
Total Protein: 7 g/dL (ref 6.5–8.1)

## 2018-09-30 LAB — PROTIME-INR
INR: 1 (ref 0.8–1.2)
Prothrombin Time: 13.3 seconds (ref 11.4–15.2)

## 2018-09-30 LAB — ABO/RH: ABO/RH(D): O POS

## 2018-09-30 LAB — APTT: aPTT: 32 seconds (ref 24–36)

## 2018-09-30 NOTE — Progress Notes (Signed)
Pt has questions regarding consent in PAT. Attempted to call Dr. Thomes Dinning office but he was not in. Pt states that what she discuss with Dr. Ilda Basset did not match consent so she has requested to speak with him first. Please have pt sign consent on DOS.  Jacqlyn Larsen, RN

## 2018-09-30 NOTE — Progress Notes (Signed)
PCP - [facility]  Cardiologist - denies  PPM/ICD - N/A Device Orders - N/A Rep Notified  Chest x-ray - N/A EKG - 09/30/18 Stress Test - denies ECHO - denies Cardiac Cath - denies  Sleep Study - denies CPAP - denies  Blood Thinner Instructions: N/A Aspirin Instructions:N/A  ERAS Protcol -Yes PRE-SURGERY Ensure - N/A  COVID TEST- Thursday, 10/03/18  Anesthesia review: No  Patient denies shortness of breath, fever, cough and chest pain at PAT appointment   Patient verbalized understanding of instructions that were given to them at the PAT appointment. Patient was also instructed that they will need to review over the PAT instructions again at home before surgery.    Coronavirus Screening  Have you experienced the following symptoms:  Cough yes/no: No Fever (>100.46F)  yes/no: No Runny nose yes/no: No Sore throat yes/no: No Difficulty breathing/shortness of breath  yes/no: No  Have you or a family member traveled in the last 14 days and where? yes/no: No   If the patient indicates "YES" to the above questions, their PAT will be rescheduled to limit the exposure to others and, the surgeon will be notified. THE PATIENT WILL NEED TO BE ASYMPTOMATIC FOR 14 DAYS.   If the patient is not experiencing any of these symptoms, the PAT nurse will instruct them to NOT bring anyone with them to their appointment since they may have these symptoms or traveled as well.   Please remind your patients and families that hospital visitation restrictions are in effect and the importance of the restrictions.

## 2018-10-03 ENCOUNTER — Other Ambulatory Visit (HOSPITAL_COMMUNITY): Payer: Medicaid Other

## 2018-10-05 ENCOUNTER — Other Ambulatory Visit (HOSPITAL_COMMUNITY)
Admission: RE | Admit: 2018-10-05 | Discharge: 2018-10-05 | Disposition: A | Discharge status: Home / Self Care | Payer: Medicaid Other | Source: Ambulatory Visit | Attending: Obstetrics and Gynecology | Admitting: Obstetrics and Gynecology

## 2018-10-05 DIAGNOSIS — Z20828 Contact with and (suspected) exposure to other viral communicable diseases: Secondary | ICD-10-CM | POA: Diagnosis not present

## 2018-10-05 DIAGNOSIS — Z01812 Encounter for preprocedural laboratory examination: Secondary | ICD-10-CM | POA: Diagnosis not present

## 2018-10-06 LAB — NOVEL CORONAVIRUS, NAA (HOSP ORDER, SEND-OUT TO REF LAB; TAT 18-24 HRS): SARS-CoV-2, NAA: NOT DETECTED

## 2018-10-07 MED ORDER — SODIUM CHLORIDE 0.9 % IV SOLN
2.0000 g | INTRAVENOUS | Status: AC
  Administered 2018-10-08 (×2): 2 g via INTRAVENOUS
  Filled 2018-10-07 (×2): qty 2

## 2018-10-08 ENCOUNTER — Encounter (HOSPITAL_COMMUNITY)
Admission: RE | Disposition: A | Discharge status: Home / Self Care | Payer: Self-pay | Source: Home / Self Care | Attending: Obstetrics and Gynecology

## 2018-10-08 ENCOUNTER — Inpatient Hospital Stay (HOSPITAL_COMMUNITY)
Admission: RE | Admit: 2018-10-08 | Discharge: 2018-10-14 | DRG: 742 | Disposition: A | Discharge status: Home / Self Care | Payer: Medicaid Other | Attending: Obstetrics and Gynecology | Admitting: Obstetrics and Gynecology

## 2018-10-08 ENCOUNTER — Inpatient Hospital Stay (HOSPITAL_COMMUNITY): Payer: Medicaid Other

## 2018-10-08 ENCOUNTER — Encounter (HOSPITAL_COMMUNITY): Payer: Self-pay | Admitting: *Deleted

## 2018-10-08 ENCOUNTER — Other Ambulatory Visit: Payer: Self-pay

## 2018-10-08 DIAGNOSIS — M4317 Spondylolisthesis, lumbosacral region: Secondary | ICD-10-CM | POA: Diagnosis present

## 2018-10-08 DIAGNOSIS — D252 Subserosal leiomyoma of uterus: Secondary | ICD-10-CM | POA: Diagnosis present

## 2018-10-08 DIAGNOSIS — I878 Other specified disorders of veins: Secondary | ICD-10-CM | POA: Diagnosis present

## 2018-10-08 DIAGNOSIS — K56609 Unspecified intestinal obstruction, unspecified as to partial versus complete obstruction: Secondary | ICD-10-CM | POA: Diagnosis present

## 2018-10-08 DIAGNOSIS — Z20828 Contact with and (suspected) exposure to other viral communicable diseases: Secondary | ICD-10-CM | POA: Diagnosis present

## 2018-10-08 DIAGNOSIS — K567 Ileus, unspecified: Secondary | ICD-10-CM | POA: Diagnosis not present

## 2018-10-08 DIAGNOSIS — K660 Peritoneal adhesions (postprocedural) (postinfection): Secondary | ICD-10-CM | POA: Diagnosis present

## 2018-10-08 DIAGNOSIS — K769 Liver disease, unspecified: Secondary | ICD-10-CM | POA: Diagnosis present

## 2018-10-08 DIAGNOSIS — D649 Anemia, unspecified: Secondary | ICD-10-CM | POA: Diagnosis present

## 2018-10-08 DIAGNOSIS — Z87891 Personal history of nicotine dependence: Secondary | ICD-10-CM

## 2018-10-08 DIAGNOSIS — D219 Benign neoplasm of connective and other soft tissue, unspecified: Secondary | ICD-10-CM | POA: Diagnosis present

## 2018-10-08 DIAGNOSIS — D62 Acute posthemorrhagic anemia: Secondary | ICD-10-CM | POA: Diagnosis not present

## 2018-10-08 DIAGNOSIS — D251 Intramural leiomyoma of uterus: Secondary | ICD-10-CM | POA: Diagnosis present

## 2018-10-08 DIAGNOSIS — J45909 Unspecified asthma, uncomplicated: Secondary | ICD-10-CM | POA: Diagnosis present

## 2018-10-08 DIAGNOSIS — I10 Essential (primary) hypertension: Secondary | ICD-10-CM | POA: Diagnosis present

## 2018-10-08 DIAGNOSIS — Z6841 Body Mass Index (BMI) 40.0 and over, adult: Secondary | ICD-10-CM | POA: Diagnosis not present

## 2018-10-08 DIAGNOSIS — Z79899 Other long term (current) drug therapy: Secondary | ICD-10-CM | POA: Diagnosis not present

## 2018-10-08 DIAGNOSIS — Z90710 Acquired absence of both cervix and uterus: Secondary | ICD-10-CM | POA: Diagnosis present

## 2018-10-08 DIAGNOSIS — N920 Excessive and frequent menstruation with regular cycle: Secondary | ICD-10-CM | POA: Diagnosis not present

## 2018-10-08 DIAGNOSIS — M549 Dorsalgia, unspecified: Secondary | ICD-10-CM | POA: Diagnosis present

## 2018-10-08 DIAGNOSIS — D259 Leiomyoma of uterus, unspecified: Secondary | ICD-10-CM

## 2018-10-08 HISTORY — PX: HYSTERECTOMY ABDOMINAL WITH SALPINGECTOMY: SHX6725

## 2018-10-08 HISTORY — PX: BOWEL RESECTION: SHX1257

## 2018-10-08 LAB — POCT PREGNANCY, URINE: Preg Test, Ur: NEGATIVE

## 2018-10-08 LAB — CBC
HCT: 29.1 % — ABNORMAL LOW (ref 36.0–46.0)
Hemoglobin: 8.5 g/dL — ABNORMAL LOW (ref 12.0–15.0)
MCH: 21.9 pg — ABNORMAL LOW (ref 26.0–34.0)
MCHC: 29.2 g/dL — ABNORMAL LOW (ref 30.0–36.0)
MCV: 75 fL — ABNORMAL LOW (ref 80.0–100.0)
Platelets: 266 10*3/uL (ref 150–400)
RBC: 3.88 MIL/uL (ref 3.87–5.11)
RDW: 29.5 % — ABNORMAL HIGH (ref 11.5–15.5)
WBC: 19.2 10*3/uL — ABNORMAL HIGH (ref 4.0–10.5)
nRBC: 0 % (ref 0.0–0.2)

## 2018-10-08 LAB — POCT I-STAT 4, (NA,K, GLUC, HGB,HCT)
Glucose, Bld: 125 mg/dL — ABNORMAL HIGH (ref 70–99)
HCT: 27 % — ABNORMAL LOW (ref 36.0–46.0)
Hemoglobin: 9.2 g/dL — ABNORMAL LOW (ref 12.0–15.0)
Potassium: 4.2 mmol/L (ref 3.5–5.1)
Sodium: 140 mmol/L (ref 135–145)

## 2018-10-08 LAB — PREPARE RBC (CROSSMATCH)

## 2018-10-08 SURGERY — HYSTERECTOMY, TOTAL, ABDOMINAL, WITH SALPINGECTOMY
Anesthesia: General | Site: Abdomen

## 2018-10-08 MED ORDER — SODIUM CHLORIDE 0.9 % IV SOLN
2.0000 g | Freq: Once | INTRAVENOUS | Status: AC
  Administered 2018-10-08: 17:00:00 2 g via INTRAVENOUS
  Filled 2018-10-08 (×3): qty 2

## 2018-10-08 MED ORDER — ALBUTEROL SULFATE HFA 108 (90 BASE) MCG/ACT IN AERS
INHALATION_SPRAY | RESPIRATORY_TRACT | Status: AC
  Filled 2018-10-08: qty 6.7

## 2018-10-08 MED ORDER — HYDROMORPHONE HCL 1 MG/ML IJ SOLN
INTRAMUSCULAR | Status: DC | PRN
  Administered 2018-10-08: 0.5 mg via INTRAVENOUS

## 2018-10-08 MED ORDER — PROPOFOL 10 MG/ML IV BOLUS
INTRAVENOUS | Status: DC | PRN
  Administered 2018-10-08: 200 mg via INTRAVENOUS

## 2018-10-08 MED ORDER — KETOROLAC TROMETHAMINE 30 MG/ML IJ SOLN
INTRAMUSCULAR | Status: AC
  Filled 2018-10-08: qty 1

## 2018-10-08 MED ORDER — PHENYLEPHRINE 40 MCG/ML (10ML) SYRINGE FOR IV PUSH (FOR BLOOD PRESSURE SUPPORT)
PREFILLED_SYRINGE | INTRAVENOUS | Status: AC
  Filled 2018-10-08: qty 10

## 2018-10-08 MED ORDER — LACTATED RINGERS IV SOLN
INTRAVENOUS | Status: DC
  Administered 2018-10-08 – 2018-10-09 (×2): via INTRAVENOUS

## 2018-10-08 MED ORDER — PROPOFOL 10 MG/ML IV BOLUS
INTRAVENOUS | Status: AC
  Filled 2018-10-08: qty 20

## 2018-10-08 MED ORDER — MIDAZOLAM HCL 2 MG/2ML IJ SOLN
INTRAMUSCULAR | Status: AC
  Filled 2018-10-08: qty 2

## 2018-10-08 MED ORDER — AMLODIPINE BESYLATE 10 MG PO TABS
10.0000 mg | ORAL_TABLET | Freq: Every day | ORAL | Status: DC
  Administered 2018-10-08 – 2018-10-14 (×6): 10 mg via ORAL
  Filled 2018-10-08 (×7): qty 1

## 2018-10-08 MED ORDER — EPHEDRINE 5 MG/ML INJ
INTRAVENOUS | Status: AC
  Filled 2018-10-08: qty 10

## 2018-10-08 MED ORDER — SODIUM CHLORIDE 0.9% IV SOLUTION: Freq: Once | INTRAVENOUS | Status: DC

## 2018-10-08 MED ORDER — DIPHENHYDRAMINE HCL 12.5 MG/5ML PO ELIX: 12.5000 mg | ORAL_SOLUTION | Freq: Four times a day (QID) | ORAL | Status: DC | PRN

## 2018-10-08 MED ORDER — SODIUM CHLORIDE 0.9% FLUSH: 9.0000 mL | INTRAVENOUS | Status: DC | PRN

## 2018-10-08 MED ORDER — KETAMINE HCL 50 MG/5ML IJ SOSY
PREFILLED_SYRINGE | INTRAMUSCULAR | Status: AC
  Filled 2018-10-08: qty 5

## 2018-10-08 MED ORDER — LACTATED RINGERS IV SOLN
INTRAVENOUS | Status: DC | PRN
  Administered 2018-10-08 (×2): via INTRAVENOUS

## 2018-10-08 MED ORDER — DEXAMETHASONE SODIUM PHOSPHATE 10 MG/ML IJ SOLN
INTRAMUSCULAR | Status: AC
  Filled 2018-10-08: qty 2

## 2018-10-08 MED ORDER — ROCURONIUM BROMIDE 10 MG/ML (PF) SYRINGE
PREFILLED_SYRINGE | INTRAVENOUS | Status: AC
  Filled 2018-10-08: qty 30

## 2018-10-08 MED ORDER — ALBUTEROL SULFATE HFA 108 (90 BASE) MCG/ACT IN AERS
INHALATION_SPRAY | RESPIRATORY_TRACT | Status: DC | PRN
  Administered 2018-10-08 (×2): 4 via RESPIRATORY_TRACT

## 2018-10-08 MED ORDER — SIMETHICONE 80 MG PO CHEW
80.0000 mg | CHEWABLE_TABLET | Freq: Three times a day (TID) | ORAL | Status: DC
  Administered 2018-10-08 – 2018-10-14 (×17): 80 mg via ORAL
  Filled 2018-10-08 (×17): qty 1

## 2018-10-08 MED ORDER — SODIUM CHLORIDE 0.9 % IR SOLN
Status: DC | PRN
  Administered 2018-10-08: 1000 mL

## 2018-10-08 MED ORDER — PANTOPRAZOLE SODIUM 40 MG PO TBEC
40.0000 mg | DELAYED_RELEASE_TABLET | Freq: Every day | ORAL | Status: DC
  Administered 2018-10-08 – 2018-10-14 (×5): 40 mg via ORAL
  Filled 2018-10-08 (×7): qty 1

## 2018-10-08 MED ORDER — EPHEDRINE SULFATE 50 MG/ML IJ SOLN
INTRAMUSCULAR | Status: DC | PRN
  Administered 2018-10-08: 5 mg via INTRAVENOUS

## 2018-10-08 MED ORDER — NALOXONE HCL 0.4 MG/ML IJ SOLN: 0.4000 mg | INTRAMUSCULAR | Status: DC | PRN

## 2018-10-08 MED ORDER — FENTANYL CITRATE (PF) 250 MCG/5ML IJ SOLN
INTRAMUSCULAR | Status: AC
  Filled 2018-10-08: qty 5

## 2018-10-08 MED ORDER — ONDANSETRON HCL 4 MG/2ML IJ SOLN: 4.0000 mg | Freq: Four times a day (QID) | INTRAMUSCULAR | Status: DC | PRN

## 2018-10-08 MED ORDER — ONDANSETRON HCL 4 MG/2ML IJ SOLN
4.0000 mg | Freq: Four times a day (QID) | INTRAMUSCULAR | Status: DC | PRN
  Administered 2018-10-10 – 2018-10-11 (×2): 4 mg via INTRAVENOUS
  Filled 2018-10-08 (×2): qty 2

## 2018-10-08 MED ORDER — ONDANSETRON HCL 4 MG/2ML IJ SOLN
INTRAMUSCULAR | Status: DC | PRN
  Administered 2018-10-08: 4 mg via INTRAVENOUS

## 2018-10-08 MED ORDER — FENTANYL CITRATE (PF) 100 MCG/2ML IJ SOLN
INTRAMUSCULAR | Status: AC
  Filled 2018-10-08: qty 2

## 2018-10-08 MED ORDER — LACTATED RINGERS IV SOLN
INTRAVENOUS | Status: DC
  Administered 2018-10-08: 10:00:00 via INTRAVENOUS

## 2018-10-08 MED ORDER — LIDOCAINE 2% (20 MG/ML) 5 ML SYRINGE
INTRAMUSCULAR | Status: DC | PRN
  Administered 2018-10-08: 100 mg via INTRAVENOUS

## 2018-10-08 MED ORDER — DEXAMETHASONE SODIUM PHOSPHATE 10 MG/ML IJ SOLN
INTRAMUSCULAR | Status: DC | PRN
  Administered 2018-10-08: 4 mg via INTRAVENOUS

## 2018-10-08 MED ORDER — FENTANYL CITRATE (PF) 100 MCG/2ML IJ SOLN
25.0000 ug | INTRAMUSCULAR | Status: DC | PRN
  Administered 2018-10-08 (×2): 50 ug via INTRAVENOUS

## 2018-10-08 MED ORDER — MIDAZOLAM HCL 5 MG/5ML IJ SOLN
INTRAMUSCULAR | Status: DC | PRN
  Administered 2018-10-08: 2 mg via INTRAVENOUS

## 2018-10-08 MED ORDER — ONDANSETRON HCL 4 MG PO TABS: 4.0000 mg | ORAL_TABLET | Freq: Four times a day (QID) | ORAL | Status: DC | PRN

## 2018-10-08 MED ORDER — HYDROMORPHONE 1 MG/ML IV SOLN
INTRAVENOUS | Status: AC
  Filled 2018-10-08: qty 30

## 2018-10-08 MED ORDER — ACETAMINOPHEN 500 MG PO TABS
1000.0000 mg | ORAL_TABLET | Freq: Once | ORAL | Status: AC
  Administered 2018-10-08: 10:00:00 1000 mg via ORAL
  Filled 2018-10-08: qty 2

## 2018-10-08 MED ORDER — FENTANYL CITRATE (PF) 250 MCG/5ML IJ SOLN
INTRAMUSCULAR | Status: DC | PRN
  Administered 2018-10-08 (×5): 50 ug via INTRAVENOUS

## 2018-10-08 MED ORDER — ROCURONIUM BROMIDE 10 MG/ML (PF) SYRINGE
PREFILLED_SYRINGE | INTRAVENOUS | Status: DC | PRN
  Administered 2018-10-08: 10 mg via INTRAVENOUS
  Administered 2018-10-08: 15 mg via INTRAVENOUS
  Administered 2018-10-08: 20 mg via INTRAVENOUS
  Administered 2018-10-08: 10 mg via INTRAVENOUS
  Administered 2018-10-08: 100 mg via INTRAVENOUS

## 2018-10-08 MED ORDER — HYDROMORPHONE 1 MG/ML IV SOLN
INTRAVENOUS | Status: DC
  Administered 2018-10-08: 2.7 mg via INTRAVENOUS
  Administered 2018-10-08: 30 mg via INTRAVENOUS
  Administered 2018-10-09: 2.1 mg via INTRAVENOUS
  Administered 2018-10-09: 1.2 mg via INTRAVENOUS
  Administered 2018-10-09: 1.5 mg via INTRAVENOUS
  Administered 2018-10-09: 2.1 mg via INTRAVENOUS
  Administered 2018-10-09: 1.5 mg via INTRAVENOUS

## 2018-10-08 MED ORDER — KETAMINE HCL 10 MG/ML IJ SOLN
INTRAMUSCULAR | Status: DC | PRN
  Administered 2018-10-08 (×3): 10 mg via INTRAVENOUS
  Administered 2018-10-08: 20 mg via INTRAVENOUS

## 2018-10-08 MED ORDER — DIPHENHYDRAMINE HCL 50 MG/ML IJ SOLN: 12.5000 mg | Freq: Four times a day (QID) | INTRAMUSCULAR | Status: DC | PRN

## 2018-10-08 MED ORDER — LACTATED RINGERS IV SOLN: INTRAVENOUS | Status: DC

## 2018-10-08 MED ORDER — ONDANSETRON HCL 4 MG/2ML IJ SOLN
INTRAMUSCULAR | Status: AC
  Filled 2018-10-08: qty 4

## 2018-10-08 MED ORDER — HYDROMORPHONE HCL 1 MG/ML IJ SOLN
INTRAMUSCULAR | Status: AC
  Filled 2018-10-08: qty 0.5

## 2018-10-08 MED ORDER — PHENYLEPHRINE 40 MCG/ML (10ML) SYRINGE FOR IV PUSH (FOR BLOOD PRESSURE SUPPORT)
PREFILLED_SYRINGE | INTRAVENOUS | Status: DC | PRN
  Administered 2018-10-08 (×3): 80 ug via INTRAVENOUS
  Administered 2018-10-08: 40 ug via INTRAVENOUS

## 2018-10-08 MED ORDER — SUGAMMADEX SODIUM 200 MG/2ML IV SOLN
INTRAVENOUS | Status: DC | PRN
  Administered 2018-10-08: 250 mg via INTRAVENOUS

## 2018-10-08 MED ORDER — LIDOCAINE 2% (20 MG/ML) 5 ML SYRINGE
INTRAMUSCULAR | Status: AC
  Filled 2018-10-08: qty 10

## 2018-10-08 MED ORDER — ALBUMIN HUMAN 5 % IV SOLN
INTRAVENOUS | Status: DC | PRN
  Administered 2018-10-08 (×3): via INTRAVENOUS

## 2018-10-08 SURGICAL SUPPLY — 51 items
BARRIER ADHS 3X4 INTERCEED (GAUZE/BANDAGES/DRESSINGS) ×5 IMPLANT
BENZOIN TINCTURE PRP APPL 2/3 (GAUZE/BANDAGES/DRESSINGS) ×5 IMPLANT
CANISTER SUCT 3000ML PPV (MISCELLANEOUS) ×5 IMPLANT
CLOSURE WOUND 1/2 X4 (GAUZE/BANDAGES/DRESSINGS) ×1
COVER WAND RF STERILE (DRAPES) IMPLANT
DECANTER SPIKE VIAL GLASS SM (MISCELLANEOUS) IMPLANT
DRAPE WARM FLUID 44X44 (DRAPES) IMPLANT
DRSG OPSITE POSTOP 4X10 (GAUZE/BANDAGES/DRESSINGS) ×5 IMPLANT
DURAPREP 26ML APPLICATOR (WOUND CARE) ×5 IMPLANT
ELECT REM PT RETURN 9FT ADLT (ELECTROSURGICAL) ×5
ELECTRODE REM PT RTRN 9FT ADLT (ELECTROSURGICAL) ×3 IMPLANT
GAUZE 4X4 16PLY RFD (DISPOSABLE) ×10 IMPLANT
GLOVE BIO SURGEON STRL SZ7 (GLOVE) ×5 IMPLANT
GLOVE BIOGEL PI IND STRL 7.0 (GLOVE) ×6 IMPLANT
GLOVE BIOGEL PI IND STRL 7.5 (GLOVE) ×3 IMPLANT
GLOVE BIOGEL PI INDICATOR 7.0 (GLOVE) ×4
GLOVE BIOGEL PI INDICATOR 7.5 (GLOVE) ×2
GOWN STRL REUS W/ TWL LRG LVL3 (GOWN DISPOSABLE) ×6 IMPLANT
GOWN STRL REUS W/ TWL XL LVL3 (GOWN DISPOSABLE) ×3 IMPLANT
GOWN STRL REUS W/TWL LRG LVL3 (GOWN DISPOSABLE) ×4
GOWN STRL REUS W/TWL XL LVL3 (GOWN DISPOSABLE) ×2
HIBICLENS CHG 4% 4OZ BTL (MISCELLANEOUS) ×5 IMPLANT
KIT TURNOVER KIT B (KITS) ×5 IMPLANT
LIGASURE IMPACT 36 18CM CVD LR (INSTRUMENTS) ×5 IMPLANT
PACK ABDOMINAL GYN (CUSTOM PROCEDURE TRAY) ×5 IMPLANT
PAD ARMBOARD 7.5X6 YLW CONV (MISCELLANEOUS) IMPLANT
PAD OB MATERNITY 4.3X12.25 (PERSONAL CARE ITEMS) ×5 IMPLANT
RELOAD PROXIMATE 75MM BLUE (ENDOMECHANICALS) ×5 IMPLANT
RELOAD PROXIMATE TA60MM BLUE (ENDOMECHANICALS) ×5 IMPLANT
SPECIMEN JAR MEDIUM (MISCELLANEOUS) ×5 IMPLANT
SPONGE LAP 18X18 RF (DISPOSABLE) ×20 IMPLANT
STAPLER GUN LINEAR PROX 60 (STAPLE) ×5 IMPLANT
STAPLER PROXIMATE 75MM BLUE (STAPLE) ×10 IMPLANT
STAPLER VISISTAT 35W (STAPLE) ×5 IMPLANT
STRIP CLOSURE SKIN 1/2X4 (GAUZE/BANDAGES/DRESSINGS) ×4 IMPLANT
SUT MON AB 4-0 PS1 27 (SUTURE) ×5 IMPLANT
SUT PDS AB 0 CTX 60 (SUTURE) IMPLANT
SUT PLAIN 2 0 XLH (SUTURE) IMPLANT
SUT VIC AB 0 CT1 36 (SUTURE) ×5 IMPLANT
SUT VIC AB 1 CT1 18XBRD ANBCTR (SUTURE) ×6 IMPLANT
SUT VIC AB 1 CT1 18XCR BRD 8 (SUTURE) ×3 IMPLANT
SUT VIC AB 1 CT1 8-18 (SUTURE) ×6
SUT VIC AB 1 CTB1 27 (SUTURE) ×10 IMPLANT
SUT VIC AB 2-0 SH 18 (SUTURE) ×5 IMPLANT
SUT VIC AB 2-0 SH 27 (SUTURE) ×2
SUT VIC AB 2-0 SH 27XBRD (SUTURE) ×3 IMPLANT
SUT VIC AB 3-0 CT1 27 (SUTURE) ×2
SUT VIC AB 3-0 CT1 TAPERPNT 27 (SUTURE) ×3 IMPLANT
SUT VIC AB 3-0 SH 18 (SUTURE) ×10 IMPLANT
TOWEL GREEN STERILE FF (TOWEL DISPOSABLE) ×5 IMPLANT
TRAY FOLEY W/BAG SLVR 14FR (SET/KITS/TRAYS/PACK) ×5 IMPLANT

## 2018-10-08 NOTE — Anesthesia Postprocedure Evaluation (Signed)
Anesthesia Post Note  Patient: Colleen Pham  Procedure(s) Performed: HYSTERECTOMY ABDOMINAL WITH BILATERAL SALPINGECTOMY AND REMOVAL OF FILSHIE CLIP FLOATING IN PELVIS (Bilateral Abdomen) CYSTOSCOPY (N/A Bladder) Small Bowel Resection (N/A Abdomen)     Patient location during evaluation: PACU Anesthesia Type: General Level of consciousness: awake and alert, oriented and patient cooperative Pain management: pain level controlled Vital Signs Assessment: post-procedure vital signs reviewed and stable Respiratory status: spontaneous breathing, nonlabored ventilation and respiratory function stable Cardiovascular status: blood pressure returned to baseline and stable Postop Assessment: no apparent nausea or vomiting Anesthetic complications: no    Last Vitals:  Vitals:   10/08/18 0857 10/08/18 0939  BP: (!) 168/107 (!) 164/100  Pulse: 76   Resp: 18   Temp: 36.8 C   SpO2: 100%     Last Pain:  Vitals:   10/08/18 0934  PainSc: 0-No pain                 Jarome Matin Shermaine Rivet

## 2018-10-08 NOTE — Anesthesia Preprocedure Evaluation (Signed)
Anesthesia Evaluation  Patient identified by MRN, date of birth, ID band Patient awake    Reviewed: Allergy & Precautions, NPO status , Patient's Chart, lab work & pertinent test results  Airway Mallampati: I  TM Distance: >3 FB Neck ROM: Full    Dental no notable dental hx. (+)    Pulmonary asthma , former smoker,    Pulmonary exam normal breath sounds clear to auscultation       Cardiovascular hypertension, negative cardio ROS Normal cardiovascular exam Rhythm:Regular Rate:Normal     Neuro/Psych negative neurological ROS  negative psych ROS   GI/Hepatic negative GI ROS, Neg liver ROS,   Endo/Other  Morbid obesity  Renal/GU negative Renal ROS  negative genitourinary   Musculoskeletal negative musculoskeletal ROS (+)   Abdominal   Peds  Hematology  (+) Blood dyscrasia (Hgb 9.3), anemia ,   Anesthesia Other Findings   Reproductive/Obstetrics                             Anesthesia Physical Anesthesia Plan  ASA: III  Anesthesia Plan: General   Post-op Pain Management:    Induction: Intravenous  PONV Risk Score and Plan: 3 and Midazolam, Dexamethasone and Ondansetron  Airway Management Planned: Oral ETT  Additional Equipment:   Intra-op Plan:   Post-operative Plan: Extubation in OR  Informed Consent: I have reviewed the patients History and Physical, chart, labs and discussed the procedure including the risks, benefits and alternatives for the proposed anesthesia with the patient or authorized representative who has indicated his/her understanding and acceptance.     Dental advisory given  Plan Discussed with: CRNA  Anesthesia Plan Comments:         Anesthesia Quick Evaluation

## 2018-10-08 NOTE — Brief Op Note (Signed)
10/08/2018  3:16 PM  PATIENT:  Colleen Pham  38 y.o. female  PRE-OPERATIVE DIAGNOSIS:  Fibroids Menorrhagia Pelvic adhesive disease  POST-OPERATIVE DIAGNOSIS:  Same  PROCEDURE:  Total abdominal hysterectomy, bilateral salpingectomy by GYN;  lysis of adhesion, small bowel resection and reanastomosis by General Surgery  SURGEON:  Surgeon(s) and Role:    * Aletha Halim, MD - Primary    * Chancy Milroy, MD - Assisting    * Erroll Luna, MD - Assisting    * Sloan Leiter, MD - Assisting   ANESTHESIA:   general  EBL:  950 mL   BLOOD ADMINISTERED:none   IVF: 2214mL crystalloid, 789mL albumin  DRAINS: indwellilng foley 25mL UOP   LOCAL MEDICATIONS USED:  NONE  SPECIMEN: cervix, uterus, both fallopian tubes, small bowel segment  DISPOSITION OF SPECIMEN:  PATHOLOGY  COUNTS:  YES  TOURNIQUET:  * No tourniquets in log *  DICTATION: .Note written in EPIC  PLAN OF CARE: Admit to inpatient   PATIENT DISPOSITION:  PACU - hemodynamically stable.   Delay start of Pharmacological VTE agent (>24hrs) due to surgical blood loss or risk of bleeding: yes  [name] MD Attending [facility] ([facility]) GYN Consult Phone: [phone] (M-F, 0800-1700) & [phone] (Off hours, weekends, holidays)

## 2018-10-08 NOTE — Transfer of Care (Signed)
Immediate Anesthesia Transfer of Care Note  Patient: CRYSTRAL MITCHNER  Procedure(s) Performed: HYSTERECTOMY ABDOMINAL WITH BILATERAL SALPINGECTOMY AND REMOVAL OF FILSHIE CLIP FLOATING IN PELVIS (Bilateral Abdomen) CYSTOSCOPY (N/A Bladder) Small Bowel Resection (N/A Abdomen)  Patient Location: PACU  Anesthesia Type:General  Level of Consciousness: drowsy and patient cooperative  Airway & Oxygen Therapy: Patient Spontanous Breathing and Patient connected to face mask oxygen  Post-op Assessment: Report given to RN and Post -op Vital signs reviewed and stable  Post vital signs: Reviewed and stable  Last Vitals:  Vitals Value Taken Time  BP 183/96 10/08/18 1532  Temp    Pulse 91 10/08/18 1537  Resp 22 10/08/18 1537  SpO2 100 % 10/08/18 1537  Vitals shown include unvalidated device data.  Last Pain:  Vitals:   10/08/18 0934  PainSc: 0-No pain      Patients Stated Pain Goal: 3 ([id number])  Complications: No apparent anesthesia complications

## 2018-10-08 NOTE — Anesthesia Procedure Notes (Signed)
Procedure Name: MAC Date/Time: 10/08/2018 10:45 AM Performed by: Colin Benton, CRNA Pre-anesthesia Checklist: Patient identified, Emergency Drugs available, Suction available, Patient being monitored and Timeout performed Patient Re-evaluated:Patient Re-evaluated prior to induction Oxygen Delivery Method: Circle system utilized Preoxygenation: Pre-oxygenation with 100% oxygen Induction Type: IV induction Ventilation: Mask ventilation without difficulty Laryngoscope Size: Mac and 3 Grade View: Grade I Tube type: Oral Number of attempts: 1 Airway Equipment and Method: Stylet Placement Confirmation: ETT inserted through vocal cords under direct vision,  positive ETCO2 and breath sounds checked- equal and bilateral Secured at: 21 cm Tube secured with: Tape Dental Injury: Teeth and Oropharynx as per pre-operative assessment
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
## 2018-10-08 NOTE — Progress Notes (Signed)
GYN Post Op Check Note  10/08/2018 - 6:02 PM  Brief Clinical History: 38 y.o. POD#0 s/p TAH/bilateral salpingectomy/small bowel resection and reanastomosis by GSU (EBL 950 mL) for fibroids.   PMHx significant for HTN, BMI 40s.  Subjective: resting. With some pain with pca  Objective:  Current Vital Signs 24h Vital Sign Ranges  T (!) 97 F (36.1 C) Temp  Avg: 97.9 F (36.6 C)  Min: 97 F (36.1 C)  Max: 98.6 F (37 C)  BP (!) 165/93 BP  Min: 164/100  Max: 188/95  HR 99 Pulse  Avg: 93.9  Min: 76  Max: 102  RR 19 Resp  Avg: 21.1  Min: 18  Max: 27  SaO2 100 % Room Air SpO2  Avg: 99.4 %  Min: 98 %  Max: 100 %       24 Hour I/O Current Shift I/O  Time Ins Outs No intake/output data recorded. 09/29 0701 - 09/29 1900 In: 2900 [I.V.:2200] Out: [id number] [Urine:825]   Patient Vitals for the past 24 hrs:  BP Temp Pulse Resp SpO2 Height Weight  10/08/18 1732 (!) 165/93 (!) 97 F (36.1 C) 99 19 100 % - -  10/08/18 1656 (!) 176/99 - 98 (!) 23 99 % - -  10/08/18 1654 - - - 18 - - -  10/08/18 1641 (!) 188/95 - (!) 102 (!) 25 98 % - -  10/08/18 1629 (!) 179/99 - 97 19 98 % - -  10/08/18 1615 (!) 185/92 - 96 (!) 21 100 % - -  10/08/18 1547 (!) 185/92 - 92 20 100 % - -  10/08/18 1532 (!) 183/96 98.6 F (37 C) 91 (!) 27 100 % - -  10/08/18 0939 (!) 164/100 - - - - - -  10/08/18 0859 - - - - - 5\' 2"  (1.575 m) 122.5 kg  10/08/18 0857 (!) 168/107 98.2 F (36.8 C) 76 18 100 % - -     General: NAD Abdomen: rare BS. C/d/i incision. approp ttp, non distended GU: no gross VB. Foley in place with normal UOP CV: S1, S2 normal, no murmur, rub or gallop, regular rate and rhythm Pulm: CTAB Extremities: no clubbing, cyanosis or edema; SCDs in place Neuro: A&O x 3  Medications: Current Facility-Administered Medications  Medication Dose Route Frequency Provider Last Rate Last Dose  . 0.9 %  sodium chloride infusion (Manually program via Guardrails IV Fluids)   Intravenous Once Aletha Halim, MD       . 0.9 %  sodium chloride infusion (Manually program via Guardrails IV Fluids)   Intravenous Once Everlean Cherry A, CRNA      . amLODipine (NORVASC) tablet 10 mg  10 mg Oral Daily Aletha Halim, MD      . fentaNYL (SUBLIMAZE) 100 MCG/2ML injection           . HYDROmorphone (DILAUDID) 1 mg/mL PCA injection   Intravenous Q4H Aletha Halim, MD   30 mg at 10/08/18 1654  . HYDROmorphone (DILAUDID) 1 mg/mL PCA injection           . lactated ringers infusion   Intravenous Continuous Aletha Halim, MD 75 mL/hr at 10/08/18 1630      Laboratory: CBC Latest Ref Rng & Units 10/08/2018 09/30/2018 09/24/2018  WBC 4.0 - 10.5 K/uL - 7.4 5.5  Hemoglobin 12.0 - 15.0 g/dL 9.2(L) 9.3(L) 8.9(L)  Hematocrit 36.0 - 46.0 % 27.0(L) 35.0(L) 32.2(L)  Platelets 150 - 400 K/uL - 353 277    Recent Labs  Lab 10/08/18 1456  NA 140  K 4.2  GLUCOSE 0000000*   Conflict (See Lab Report): O POS/O POS Performed at [facility], [place] [address], [place], [place] [address]   Radiology: No new imaging  A/P: pt doing well *GYN: routine post op care. Continue foley and pca for tonight. Try for PO meds if okay with GSU tomorrow and pull foley tomorro *HTN: I started patient on norvasc 10mg  qday to start today *FEN/GI: d/w pt re: parasitic fibroid and need for SBR. clears, MIVF, s/p 1 dose of cefoxitin post op. GSU following *[id number], OOB tomorrow *Pain:dilaudid PCA *Dispo: likely pod#2  [name] MD Attending [facility] [place] ([facility]ulty [facility]) [phone]

## 2018-10-08 NOTE — Op Note (Signed)
Preoperative diagnosis: Uterine fibroid attached to distal ileum  Postop diagnosis: Same  Procedure: Small bowel resection with anastomosis  Surgeon: Josephina Gip, MD  Assistant: Dr. Ilda Basset MD  EBL: 80 cc  Drains: None  Indications for procedure: Patient was undergoing a hysterectomy by Dr. Ilda Basset for uterine fibroid.  He was able to resect it down to the cervical cuff but this was attached to the distal small bowel.  I was asked to come in intraoperatively to assess and to assist.  Upon evaluation she had an area densely adherent to her distal small bowel where the uterus was suspended.  This appeared to be growing into the small bowel and I felt the small bowel obstruction was most appropriate to prevent future recurrence of disease at the small bowel.  Description of procedure: After assessing the patient intraoperatively, I was able scrubbed in.  The uterus was dense adherent to the distal small bowel.  I went ahead and dissected this off erring on the side of the uterus.  There is an area there were no significant ingrowth of uterine tissue into the small bowel and I felt this would be an area of recurrence that resection would be in her best interest.  I was able to divide the small bowel proximal and distal to this area of disease.  This was done with a GIA 75 stapler.  I then created a side-to-side anastomosis with a GIA 75 and a TX 60 to close the common robbery.  Is closed nicely with no evidence of bleeding from anastomosis.  I then put 2 sutures at the crotch of the staple line for support.  I oversewed the suture suture line with 2-0 Vicryl to be some oozing from the suture line.  This provided adequate hemostasis.  Upon evaluation anastomosis there is no tension and I was able to milk contents across the anastomosis without signs of leakage.  This was replaced back in abdominal cavity.  At this point of the case Dr. Ilda Basset completed the abdominal hysterectomy and closure.  See his  op note for details.  At this point time all counts were correct.

## 2018-10-09 ENCOUNTER — Encounter (HOSPITAL_COMMUNITY): Payer: Self-pay | Admitting: Obstetrics and Gynecology

## 2018-10-09 LAB — BASIC METABOLIC PANEL
Anion gap: 7 (ref 5–15)
BUN: 7 mg/dL (ref 6–20)
CO2: 22 mmol/L (ref 22–32)
Calcium: 7.7 mg/dL — ABNORMAL LOW (ref 8.9–10.3)
Chloride: 106 mmol/L (ref 98–111)
Creatinine, Ser: 0.79 mg/dL (ref 0.44–1.00)
GFR calc Af Amer: >60 mL/min (ref >60)
GFR calc non Af Amer: >60 mL/min (ref >60)
Glucose, Bld: 130 mg/dL — ABNORMAL HIGH (ref 70–99)
Potassium: 4.7 mmol/L (ref 3.5–5.1)
Sodium: 135 mmol/L (ref 135–145)

## 2018-10-09 LAB — CBC
HCT: 25.5 % — ABNORMAL LOW (ref 36.0–46.0)
Hemoglobin: 7.4 g/dL — ABNORMAL LOW (ref 12.0–15.0)
MCH: 21.8 pg — ABNORMAL LOW (ref 26.0–34.0)
MCHC: 29 g/dL — ABNORMAL LOW (ref 30.0–36.0)
MCV: 75 fL — ABNORMAL LOW (ref 80.0–100.0)
Platelets: 280 10*3/uL (ref 150–400)
RBC: 3.4 MIL/uL — ABNORMAL LOW (ref 3.87–5.11)
RDW: 29.8 % — ABNORMAL HIGH (ref 11.5–15.5)
WBC: 14.7 10*3/uL — ABNORMAL HIGH (ref 4.0–10.5)
nRBC: 0 % (ref 0.0–0.2)

## 2018-10-09 LAB — HEMOGLOBIN AND HEMATOCRIT, BLOOD
HCT: 23.6 % — ABNORMAL LOW (ref 36.0–46.0)
HCT: 28.8 % — ABNORMAL LOW (ref 36.0–46.0)
Hemoglobin: 6.9 g/dL — CL (ref 12.0–15.0)
Hemoglobin: 8.3 g/dL — ABNORMAL LOW (ref 12.0–15.0)

## 2018-10-09 MED ORDER — CHLORHEXIDINE GLUCONATE CLOTH 2 % EX PADS
6.0000 | MEDICATED_PAD | Freq: Every day | CUTANEOUS | Status: DC
  Administered 2018-10-10 – 2018-10-14 (×2): 6 via TOPICAL

## 2018-10-09 MED ORDER — SODIUM CHLORIDE 0.9% IV SOLUTION: Freq: Once | INTRAVENOUS | Status: DC

## 2018-10-09 MED ORDER — ALBUTEROL SULFATE (2.5 MG/3ML) 0.083% IN NEBU
2.5000 mg | INHALATION_SOLUTION | RESPIRATORY_TRACT | Status: AC
  Administered 2018-10-09: 2.5 mg via RESPIRATORY_TRACT
  Filled 2018-10-09: qty 3

## 2018-10-09 MED ORDER — ACETAMINOPHEN 325 MG PO TABS
650.0000 mg | ORAL_TABLET | Freq: Once | ORAL | Status: AC
  Administered 2018-10-09: 16:00:00 650 mg via ORAL
  Filled 2018-10-09: qty 2

## 2018-10-09 MED ORDER — LIDOCAINE 5 % EX PTCH
1.0000 | MEDICATED_PATCH | CUTANEOUS | Status: DC
  Administered 2018-10-09 – 2018-10-10 (×2): 1 via TRANSDERMAL
  Filled 2018-10-09 (×2): qty 1

## 2018-10-09 MED ORDER — ACETAMINOPHEN 325 MG PO TABS
650.0000 mg | ORAL_TABLET | Freq: Four times a day (QID) | ORAL | Status: DC | PRN
  Administered 2018-10-09: 21:00:00 650 mg via ORAL
  Filled 2018-10-09: qty 2

## 2018-10-09 MED ORDER — ALBUTEROL SULFATE (2.5 MG/3ML) 0.083% IN NEBU: 2.5000 mg | INHALATION_SOLUTION | Freq: Four times a day (QID) | RESPIRATORY_TRACT | Status: DC | PRN

## 2018-10-09 MED ORDER — HYDROMORPHONE HCL 2 MG PO TABS
2.0000 mg | ORAL_TABLET | Freq: Four times a day (QID) | ORAL | Status: DC | PRN
  Administered 2018-10-09: 2 mg via ORAL
  Administered 2018-10-10: 05:00:00 4 mg via ORAL
  Filled 2018-10-09: qty 2
  Filled 2018-10-09: qty 1

## 2018-10-09 MED ORDER — ALBUTEROL SULFATE HFA 108 (90 BASE) MCG/ACT IN AERS: 1.0000 | INHALATION_SPRAY | Freq: Once | RESPIRATORY_TRACT | Status: DC

## 2018-10-09 MED ORDER — HYDROMORPHONE HCL 1 MG/ML IJ SOLN
2.0000 mg | Freq: Once | INTRAMUSCULAR | Status: AC
  Administered 2018-10-09: 2 mg via INTRAVENOUS
  Filled 2018-10-09: qty 2

## 2018-10-09 MED ORDER — ALBUTEROL SULFATE HFA 108 (90 BASE) MCG/ACT IN AERS: 1.0000 | INHALATION_SPRAY | Freq: Four times a day (QID) | RESPIRATORY_TRACT | Status: DC | PRN

## 2018-10-09 NOTE — Progress Notes (Signed)
1 Day Post-Op   Subjective/Chief Complaint: Pt having incisional pain    Objective: Vital signs in last 24 hours: Temp:  [97 F (36.1 C)-98.6 F (37 C)] 98.6 F (37 C) (09/30 0523) Pulse Rate:  [76-102] 97 (09/30 0523) Resp:  [16-27] 18 (09/30 0523) BP: (131-188)/(77-107) 131/77 (09/30 0523) SpO2:  [97 %-100 %] 99 % (09/30 0805) Weight:  [122.5 kg] 122.5 kg (09/29 0859)    Intake/Output from previous day: 09/29 0701 - 09/30 0700 In: 4156.3 [P.O.:240; I.V.:3216.3; IV Piggyback:700] Out: 2875 [Urine:1925; Blood:950] Intake/Output this shift: No intake/output data recorded.   Incision CDI   Lab Results:  Recent Labs    10/08/18 1828 10/09/18 0108  WBC 19.2* 14.7*  HGB 8.5* 7.4*  HCT 29.1* 25.5*  PLT 266 280   BMET Recent Labs    10/08/18 1456 10/09/18 0108  NA 140 135  K 4.2 4.7  CL  --  106  CO2  --  22  GLUCOSE 125* 130*  BUN  --  7  CREATININE  --  0.79  CALCIUM  --  7.7*   PT/INR No results for input(s): LABPROT, INR in the last 72 hours. ABG No results for input(s): PHART, HCO3 in the last 72 hours.  Invalid input(s): PCO2, PO2  Studies/Results: No results found.  Anti-infectives: Anti-infectives (From admission, onward)   Start     Dose/Rate Route Frequency Ordered Stop   10/08/18 1300  cefOXitin (MEFOXIN) 2 g in sodium chloride 0.9 % 100 mL IVPB     2 g 200 mL/hr over 30 Minutes Intravenous  Once 10/08/18 1239 10/08/18 1728   10/08/18 1030  cefOXitin (MEFOXIN) 2 g in sodium chloride 0.9 % 100 mL IVPB     2 g 200 mL/hr over 30 Minutes Intravenous To [facility] 10/07/18 1333 10/08/18 1624      Assessment/Plan: s/p SBR Hysterectomy by GYN  Advance diet as tolerated   Will follow    LOS: 1 day    Arieal Cuoco A Kavion Mancinas 10/09/2018

## 2018-10-09 NOTE — Progress Notes (Signed)
Wasted 68mL of Dilaudid PCA with Sharee Pimple, Therapist, sports as witness.

## 2018-10-09 NOTE — Progress Notes (Signed)
PT Cancellation Note  Patient Details Name: Colleen Pham MRN: [id number] DOB: 02-May-1980   Cancelled Treatment:    Reason Eval/Treat Not Completed: Medical issues which prohibited therapy - pt with Hgb 6.9, waiting on a unit of blood. Pt also in severe abdominal pain upon PT arrival, will check back tomorrow per RN and pt request.  Julien Girt, [place] Pager [phone]  Office [phone]    [place] 10/09/2018, 3:42 PM

## 2018-10-09 NOTE — Progress Notes (Signed)
GYN Interval Progress Note  10/09/2018 - 5:13 PM   38 y.o. POD#1 s/p TAH/bilateral salpingectomy/small bowel resection and reanastomosis by GSU (EBL 950 mL) for fibroids.   PMHx significant for HTN, BMI 40s.  Interval History: patient has ambulated some and foley out and has just voided. No nausea or vomiting. Has taken solid foods w/o issue. PRBC just started. No flatus yet. Pain moderately controlled with PCA  Objective:  Current Vital Signs 24h Vital Sign Ranges  T 98.9 F (37.2 C) Temp  Avg: 98.4 F (36.9 C)  Min: 97 F (36.1 C)  Max: 99 F (37.2 C)  BP 117/77 BP  Min: 117/77  Max: 169/100  HR 85 Pulse  Avg: 93.6  Min: 85  Max: 99  RR 12 Resp  Avg: 17.6  Min: 12  Max: 20  SaO2 99 % Nasal Cannula SpO2  Avg: 98.3 %  Min: 94 %  Max: 100 %       24 Hour I/O Current Shift I/O  Time Ins Outs 09/29 0701 - 09/30 0700 In: 4156.3 [P.O.:240; I.V.:3216.3] Out: 2875 [Urine:1925] 09/30 0701 - 09/30 1900 In: 120 [P.O.:120] Out: -    Patient Vitals for the past 24 hrs:  BP Temp Temp src Pulse Resp SpO2  10/09/18 1642 117/77 98.9 F (37.2 C) Oral 85 12 99 %  10/09/18 1619 (!) 123/51 98.6 F (37 C) Oral 87 12 100 %  10/09/18 1551 - - - - 15 100 %  10/09/18 1459 125/65 99 F (37.2 C) Oral 95 20 97 %  10/09/18 1247 - - - - 15 98 %  10/09/18 1046 137/74 98.7 F (37.1 C) Oral 91 20 94 %  10/09/18 0830 - - - - 20 99 %  10/09/18 0805 - - - - - 99 %  10/09/18 0523 131/77 98.6 F (37 C) Oral 97 18 97 %  10/09/18 0404 - - - - 16 97 %  10/09/18 0009 (!) 154/94 98 F (36.7 C) Oral 96 20 97 %  10/09/18 0004 - - - - 19 98 %  10/08/18 2057 (!) 169/100 98 F (36.7 C) Oral 99 20 100 %  10/08/18 2036 - - - - 20 100 %  10/08/18 1732 (!) 165/93 (!) 97 F (36.1 C) - 99 19 100 %  UOP:>154mL/hr   General: NAD, sitting in chair Abdomen: rare BS. C/d/i incision.  appropriately ttp, non distended CV: S1, S2 normal, no murmur, rub or gallop, regular rate and rhythm Pulm: CTAB Neuro: A&O x  3  Medications: Current Facility-Administered Medications  Medication Dose Route Frequency Provider Last Rate Last Dose  . 0.9 %  sodium chloride infusion (Manually program via Guardrails IV Fluids)   Intravenous Once Aletha Halim, MD      . acetaminophen (TYLENOL) tablet 650 mg  650 mg Oral Q6H PRN Aletha Halim, MD      . albuterol (PROVENTIL) (2.5 MG/3ML) 0.083% nebulizer solution 2.5 mg  2.5 mg Nebulization Q6H PRN Aletha Halim, MD      . amLODipine (NORVASC) tablet 10 mg  10 mg Oral Daily Aletha Halim, MD   10 mg at 10/09/18 0949  . Chlorhexidine Gluconate Cloth 2 % PADS 6 each  6 each Topical Daily Cornett, Thomas, MD      . HYDROmorphone (DILAUDID) tablet 2-4 mg  2-4 mg Oral Q6H PRN Aletha Halim, MD      . lidocaine (LIDODERM) 5 % 1 patch  1 patch Transdermal Q24H Aletha Halim, MD      .  ondansetron (ZOFRAN) tablet 4 mg  4 mg Oral Q6H PRN Aletha Halim, MD       Or  . ondansetron (ZOFRAN) injection 4 mg  4 mg Intravenous Q6H PRN Aletha Halim, MD      . pantoprazole (PROTONIX) EC tablet 40 mg  40 mg Oral Daily Aletha Halim, MD   40 mg at 10/09/18 0949  . simethicone (MYLICON) chewable tablet 80 mg  80 mg Oral TID Aletha Halim, MD   80 mg at 10/09/18 1550    Laboratory: CBC Latest Ref Rng & Units 10/09/2018 10/09/2018 10/08/2018  WBC 4.0 - 10.5 K/uL - 14.7(H) 19.2(H)  Hemoglobin 12.0 - 15.0 g/dL 6.9(LL) 7.4(L) 8.5(L)  Hematocrit 36.0 - 46.0 % 23.6(L) 25.5(L) 29.1(L)  Platelets 150 - 400 K/uL - 280 266    Recent Labs  Lab 10/08/18 1456 10/09/18 0108  NA 140 135  K 4.2 4.7  CL  --  106  CO2  --  22  BUN  --  7  CREATININE  --  0.79  CALCIUM  --  7.7*  GLUCOSE 125* 130*   Radiology: None  A/P: pt improving ADAT to regular D/c pca for po prns Anemia expected after type of surgery. Follow up am cbc and bmp Continue to watch closely for signs of ileus, etc  [name] MD Attending Center for [place] (Faculty  Practice) [phone]

## 2018-10-09 NOTE — Progress Notes (Signed)
CRITICAL VALUE ALERT  Critical Value: HGB 6.9  Date & Time Notied:  10/09/2018 1424hrs  Provider Notified: text paged 10/09/2018 at 1427hrs  Orders Received/Actions taken: awaiting orders

## 2018-10-09 NOTE — Progress Notes (Signed)
GYN Progress Note  10/09/2018 - 7:32 AM   38 y.o. POD#1 s/p TAH/bilateral salpingectomy/small bowel resection and reanastomosis by GSU (EBL 950 mL) for fibroids.   PMHx significant for HTN, BMI 40s.  Subjective: Taking some PO liquids; no ambulation, flatus yet. No fevers, chills, chest pain, sob, vomiting. Very mild, occasional nausea.   Objective:  Current Vital Signs 24h Vital Sign Ranges  T 98.6 F (37 C) Temp  Avg: 98.1 F (36.7 C)  Min: 97 F (36.1 C)  Max: 98.6 F (37 C)  BP 131/77 BP  Min: 131/77  Max: 188/95  HR 97 Pulse  Avg: 94.8  Min: 76  Max: 102  RR 18 Resp  Avg: 20.2  Min: 16  Max: 27  SaO2 97 % Nasal Cannula SpO2  Avg: 98.9 %  Min: 97 %  Max: 100 %       24 Hour I/O Current Shift I/O  Time Ins Outs 09/29 0701 - 09/30 0700 In: 4156.3 [P.O.:240; I.V.:3216.3] Out: 2875 [Urine:1925] No intake/output data recorded.   Patient Vitals for the past 24 hrs:  BP Temp Temp src Pulse Resp SpO2 Height Weight  10/09/18 0523 131/77 98.6 F (37 C) Oral 97 18 97 % - -  10/09/18 0404 - - - - 16 97 % - -  10/09/18 0009 (!) 154/94 98 F (36.7 C) Oral 96 20 97 % - -  10/09/18 0004 - - - - 19 98 % - -  10/08/18 2057 (!) 169/100 98 F (36.7 C) Oral 99 20 100 % - -  10/08/18 2036 - - - - 20 100 % - -  10/08/18 1732 (!) 165/93 (!) 97 F (36.1 C) - 99 19 100 % - -  10/08/18 1656 (!) 176/99 - - 98 (!) 23 99 % - -  10/08/18 1654 - - - - 18 - - -  10/08/18 1641 (!) 188/95 - - (!) 102 (!) 25 98 % - -  10/08/18 1629 (!) 179/99 - - 97 19 98 % - -  10/08/18 1615 (!) 185/92 - - 96 (!) 21 100 % - -  10/08/18 1547 (!) 185/92 - - 92 20 100 % - -  10/08/18 1532 (!) 183/96 98.6 F (37 C) - 91 (!) 27 100 % - -  10/08/18 0939 (!) 164/100 - - - - - - -  10/08/18 0859 - - - - - - 5\' 2"  (1.575 m) 122.5 kg  10/08/18 0857 (!) 168/107 98.2 F (36.8 C) - 76 18 100 % - -  UOP:>162mL/hr   General: NAD Abdomen: rare BS. C/d/i incision.  appropriately ttp, non distended GU: no gross VB. Foley in  place with normal UOP CV: S1, S2 normal, no murmur, rub or gallop, regular rate and rhythm Pulm: CTAB Extremities: no clubbing, cyanosis or edema; SCDs in place Neuro: A&O x 3  Medications: Current Facility-Administered Medications  Medication Dose Route Frequency Provider Last Rate Last Dose  . amLODipine (NORVASC) tablet 10 mg  10 mg Oral Daily Aletha Halim, MD   10 mg at 10/08/18 2034  . diphenhydrAMINE (BENADRYL) injection 12.5 mg  12.5 mg Intravenous Q6H PRN Aletha Halim, MD       Or  . diphenhydrAMINE (BENADRYL) 12.5 MG/5ML elixir 12.5 mg  12.5 mg Oral Q6H PRN Aletha Halim, MD      . HYDROmorphone (DILAUDID) 1 mg/mL PCA injection   Intravenous Q4H Aletha Halim, MD   30 mg at 10/08/18 1654  . lactated  ringers infusion   Intravenous Continuous Aletha Halim, MD 75 mL/hr at 10/09/18 0553    . naloxone [facility]) injection 0.4 mg  0.4 mg Intravenous PRN Aletha Halim, MD       And  . sodium chloride flush (NS) 0.9 % injection 9 mL  9 mL Intravenous PRN Aletha Halim, MD      . ondansetron (ZOFRAN) tablet 4 mg  4 mg Oral Q6H PRN Aletha Halim, MD       Or  . ondansetron (ZOFRAN) injection 4 mg  4 mg Intravenous Q6H PRN Aletha Halim, MD      . pantoprazole (PROTONIX) EC tablet 40 mg  40 mg Oral Daily Aletha Halim, MD   40 mg at 10/08/18 2034  . simethicone (MYLICON) chewable tablet 80 mg  80 mg Oral TID Aletha Halim, MD   80 mg at 10/08/18 2035    Laboratory: CBC Latest Ref Rng & Units 10/09/2018 10/08/2018 10/08/2018  WBC 4.0 - 10.5 K/uL 14.7(H) 19.2(H) -  Hemoglobin 12.0 - 15.0 g/dL 7.4(L) 8.5(L) 9.2(L)  Hematocrit 36.0 - 46.0 % 25.5(L) 29.1(L) 27.0(L)  Platelets 150 - 400 K/uL 280 266 -    Recent Labs  Lab 10/08/18 1456 10/09/18 0108  NA 140 135  K 4.2 4.7  CL  --  106  CO2  --  22  BUN  --  7  CREATININE  --  0.79  CALCIUM  --  7.7*  GLUCOSE 125* 130*   Radiology: None  A/P: pt stable *GYN: routine post op care. Continue foley  and pca for this morning. If okay with GSU, likely transition to PO PRN pain medications this afternoon and pull foley this afternoon.  *HTN: doing well on norvasc *Asthma: states takes albuterol when seasons change. Written for patient.  *FEN/GI: clears, MIVF. GSU following.  -s/p 1 dose of cefoxitin post op.  *PPx: SCDs, OOB today *Pain: dilaudid PCA. See above.  *Dispo: likely pod#2  [name] MD Attending Center for [employer] (Faculty Practice) [phone]

## 2018-10-10 LAB — CBC
HCT: 27.6 % — ABNORMAL LOW (ref 36.0–46.0)
Hemoglobin: 7.8 g/dL — ABNORMAL LOW (ref 12.0–15.0)
MCH: 21.7 pg — ABNORMAL LOW (ref 26.0–34.0)
MCHC: 28.3 g/dL — ABNORMAL LOW (ref 30.0–36.0)
MCV: 76.9 fL — ABNORMAL LOW (ref 80.0–100.0)
Platelets: 239 10*3/uL (ref 150–400)
RBC: 3.59 MIL/uL — ABNORMAL LOW (ref 3.87–5.11)
RDW: 27.9 % — ABNORMAL HIGH (ref 11.5–15.5)
WBC: 9.9 10*3/uL (ref 4.0–10.5)
nRBC: 0 % (ref 0.0–0.2)

## 2018-10-10 LAB — BASIC METABOLIC PANEL
Anion gap: 8 (ref 5–15)
BUN: 7 mg/dL (ref 6–20)
CO2: 26 mmol/L (ref 22–32)
Calcium: 8.4 mg/dL — ABNORMAL LOW (ref 8.9–10.3)
Chloride: 105 mmol/L (ref 98–111)
Creatinine, Ser: 0.87 mg/dL (ref 0.44–1.00)
GFR calc Af Amer: >60 mL/min (ref >60)
GFR calc non Af Amer: >60 mL/min (ref >60)
Glucose, Bld: 90 mg/dL (ref 70–99)
Potassium: 3.6 mmol/L (ref 3.5–5.1)
Sodium: 139 mmol/L (ref 135–145)

## 2018-10-10 LAB — SURGICAL PATHOLOGY

## 2018-10-10 MED ORDER — ACETAMINOPHEN 325 MG PO TABS
650.0000 mg | ORAL_TABLET | Freq: Four times a day (QID) | ORAL | Status: DC
  Administered 2018-10-10 – 2018-10-14 (×8): 650 mg via ORAL
  Filled 2018-10-10 (×14): qty 2

## 2018-10-10 MED ORDER — IPRATROPIUM-ALBUTEROL 0.5-2.5 (3) MG/3ML IN SOLN: 3.0000 mL | Freq: Four times a day (QID) | RESPIRATORY_TRACT | Status: DC | PRN

## 2018-10-10 MED ORDER — HYDROMORPHONE HCL 2 MG PO TABS: 2.0000 mg | ORAL_TABLET | Freq: Four times a day (QID) | ORAL | Status: DC | PRN

## 2018-10-10 MED ORDER — OXYCODONE HCL 5 MG PO TABS
5.0000 mg | ORAL_TABLET | ORAL | Status: DC | PRN
  Administered 2018-10-10 – 2018-10-11 (×2): 5 mg via ORAL
  Filled 2018-10-10 (×2): qty 1

## 2018-10-10 MED ORDER — METHOCARBAMOL 500 MG PO TABS
500.0000 mg | ORAL_TABLET | Freq: Three times a day (TID) | ORAL | Status: DC
  Administered 2018-10-10 (×3): 500 mg via ORAL
  Filled 2018-10-10 (×4): qty 1

## 2018-10-10 MED ORDER — DOCUSATE SODIUM 100 MG PO CAPS
100.0000 mg | ORAL_CAPSULE | Freq: Two times a day (BID) | ORAL | Status: DC
  Administered 2018-10-10 – 2018-10-12 (×4): 100 mg via ORAL
  Filled 2018-10-10 (×5): qty 1

## 2018-10-10 MED ORDER — DM-GUAIFENESIN ER 30-600 MG PO TB12
1.0000 | ORAL_TABLET | Freq: Two times a day (BID) | ORAL | Status: DC
  Administered 2018-10-10 – 2018-10-11 (×3): 1 via ORAL
  Filled 2018-10-10 (×7): qty 1

## 2018-10-10 NOTE — Evaluation (Signed)
Physical Therapy Evaluation Patient Details Name: Colleen Pham MRN: [id number] DOB: [birth year] Today's Date: 10/10/2018   History of Present Illness  Pt is a 38 y/o female s/p small bowel resection with anastomosis secondary to uterine fibroid attached to distal ileum; total abdominal hysterectomy, bilateral salpingectomy by GYN. PMH including but not limited to asthma and HTN.    Clinical Impression  Pt presented OOB in recliner chair, awake and willing to participate in therapy session. Prior to admission, pt reported that she was independent with all functional mobility and ADLs. Pt lives with her teenaged children in a single level home with a level entry. When asked, pt stated that her children will likely not help her much and she is unsure of who might be able to assist her upon d/c. At the time of evaluation, pt greatly limited secondary to surgical pain and fatigue. Pt overall at min guard level with transfers and ambulation using a RW. Pt would continue to benefit from skilled physical therapy services at this time while admitted and after d/c to address the below listed limitations in order to improve overall safety and independence with functional mobility.     Follow Up Recommendations Home health PT;Supervision for mobility/OOB    Equipment Recommendations  Rolling walker with 5" wheels;3in1 (PT)    Recommendations for Other Services       Precautions / Restrictions Precautions Precautions: Fall Precaution Comments: abdominal incision site Restrictions Weight Bearing Restrictions: No      Mobility  Bed Mobility               General bed mobility comments: pt OOB in recliner chair upon arrival  Transfers Overall transfer level: Needs assistance Equipment used: Rolling walker (2 wheeled) Transfers: Sit to/from Stand Sit to Stand: Min guard         General transfer comment: min guard for safety, good technique utilized; performed x1 from recliner chair  and x1 from toilet  Ambulation/Gait Ambulation/Gait assistance: Min guard Gait Distance (Feet): 50 Feet Assistive device: Rolling walker (2 wheeled) Gait Pattern/deviations: Step-through pattern;Decreased stride length Gait velocity: decreased   General Gait Details: pt with slow, cautious and steady gait with RW; pt required several standing rest breaks secondary to pain; no overt LOB or need for physical assistance, min guard for safety  Stairs            Wheelchair Mobility    Modified Rankin (Stroke Patients Only)       Balance Overall balance assessment: Needs assistance Sitting-balance support: Feet supported Sitting balance-Leahy Scale: Fair     Standing balance support: Single extremity supported;Bilateral upper extremity supported Standing balance-Leahy Scale: Poor                               Pertinent Vitals/Pain Pain Assessment: Faces Faces Pain Scale: Hurts even more Pain Location: abdomen, incision site Pain Descriptors / Indicators: Guarding;Grimacing Pain Intervention(s): Monitored during session;Repositioned    Home Living Family/patient expects to be discharged to:: Private residence Living Arrangements: Children Available Help at Discharge: Family   Home Access: Level entry     Home Layout: One level Home Equipment: None      Prior Function Level of Independence: Independent               Hand Dominance        Extremity/Trunk Assessment   Upper Extremity Assessment Upper Extremity Assessment: Overall WFL for tasks assessed  Lower Extremity Assessment Lower Extremity Assessment: Overall WFL for tasks assessed    Cervical / Trunk Assessment Cervical / Trunk Assessment: Other exceptions Cervical / Trunk Exceptions: body habitus  Communication   Communication: No difficulties  Cognition Arousal/Alertness: Awake/alert Behavior During Therapy: Flat affect Overall Cognitive Status: Impaired/Different from  baseline Area of Impairment: Safety/judgement;Problem solving                         Safety/Judgement: Decreased awareness of deficits;Decreased awareness of safety   Problem Solving: Slow processing;Requires verbal cues;Requires tactile cues        General Comments      Exercises     Assessment/Plan    PT Assessment Patient needs continued PT services  PT Problem List Decreased activity tolerance;Decreased balance;Decreased mobility;Decreased knowledge of use of DME;Pain       PT Treatment Interventions DME instruction;Gait training;Stair training;Therapeutic exercise;Functional mobility training;Therapeutic activities;Balance training;Neuromuscular re-education;Patient/family education    PT Goals (Current goals can be found in the Care Plan section)  Acute Rehab PT Goals Patient Stated Goal: decrease pain PT Goal Formulation: With patient Time For Goal Achievement: 10/24/18 Potential to Achieve Goals: Good    Frequency Min 3X/week   Barriers to discharge        Co-evaluation               AM-PAC PT "6 Clicks" Mobility  Outcome Measure Help needed turning from your back to your side while in a flat bed without using bedrails?: A Little Help needed moving from lying on your back to sitting on the side of a flat bed without using bedrails?: A Little Help needed moving to and from a bed to a chair (including a wheelchair)?: A Little Help needed standing up from a chair using your arms (e.g., wheelchair or bedside chair)?: A Little Help needed to walk in hospital room?: A Little Help needed climbing 3-5 steps with a railing? : A Little 6 Click Score: 18    End of Session   Activity Tolerance: Patient limited by pain Patient left: in chair;with call bell/phone within reach Nurse Communication: Mobility status PT Visit Diagnosis: Other abnormalities of gait and mobility (R26.89);Pain Pain - part of body: (abdomen)    Time: 1209-1223 PT Time  Calculation (min) (ACUTE ONLY): 14 min   Charges:   PT Evaluation $PT Eval Moderate Complexity: 1 Mod         Sherie Don, PT, DPT  Acute Rehabilitation Services Pager [phone] Office [place] 10/10/2018, 3:45 PM

## 2018-10-10 NOTE — Progress Notes (Signed)
2130 last night, pt crying, complaining of lower abdominal pain radiating to her back. Pain not relieved by po Dialudid given at 1830. Medicated her with Tylenol at 2112. Paged MD. Received one time order for IV Dilaudid. Pt resting comfortably after IV Dilaudid administered.

## 2018-10-10 NOTE — Op Note (Addendum)
Operative Note   10/08/2018  PRE-OP DIAGNOSIS *Fibroids *Menorrhagia    POST-OP DIAGNOSIS *Same    SURGEON: Surgeon(s) and Role:    * Aletha Halim, MD - Primary  ASSISTANT:     Chancy Milroy, MD - Assisting    * Brantley Stage, Marcello Moores, MD - Assisting    * Rosana Hoes Craig Guess, MD - Assisting  PROCEDURE: Total abdominal hysterectomy, bilateral salpingectomy by GYN and small bowel resection and anastomosis by Dr. Brantley Stage of General Surgery  ANESTHESIA: General  ESTIMATED BLOOD LOSS: 1056mL  DRAINS: indwelling foley 281mL UOP  TOTAL IV FLUIDS: 2281mL crystalloid, 767mL albumin  VTE PROPHYLAXIS: SCDs to the bilateral lower extremities  ANTIBIOTICS: cefoxitin 2gm within 1 hour of skin incision and repeat during the case and as case finished  SPECIMENS: cervix, uterus, tubes and fibroids; piece of small bowel  DISPOSITION: PACU - hemodynamically stable.  CONDITION: stable  COMPLICATIONS: large pendunculated fibroid was parasitic and had grown into a segment of small bowel  FINDINGS: exam under anesthesia showed large fibroid uterus with it up to 18 weeks in the midline and 24wks towards the right of the midline. On exploratory laparotomy. The uterus was small and felt normal but several 3cm intramural fibroids were around it.  Attached to it, there was a large (approximately 14cm x 14cm), pedunculated, slightly calcified multi-fibroid complex that was seen coming off the uterus at the right fundal area. The apex of this pedunculated fibroid was attached to a pieced of small bowel with large blood vessels seen connecting the two. Normal ovaries and tubes and cervix. Of note, the tubes appeared normal with no surgically absent segments or clips/rings attached to either tube. Extensive exploration was done in the abdomino-pelvic cavity and no clips were seen or palpated.   DESCRIPTION OF PROCEDURE: After informed consent was obtained, the patient was prepped and draped in the usual  sterile manner for an abdominal procedure and placed in the dorsal lithotomy position.  A time was done, and a modified maylard incision was made into the abdomen.  Once inside the abdominal cavity, the uterus and fibroids were palpated and the fibroids, uterus were able to be brought up through the incision, with the above noted findings. The pendunculated fibroid-small bowel segment was mobile and out of the surgical field so I did the hysterectomy first. The bilateral round ligaments were identified and transected with the bovie and a bladder flap started. Next, the bilateral utero ovarian ligaments were clamped, cut and suture ligated with 0-vicryl. The broad ligaments were further clamped, cut and suture ligated down the level of the internal os of the cervix. Heaney clamps were then used to clamp, cut and suture ligate the vessels just inferior to the internal os and below the level of the last uterine fibroid. A supracervical hysterectomy was then done with the bovie and the specimen removed from the abdomen.   General surgery came in and freed the bowel; see operative note for separate details. The abdomino-pelvic region was then explored and no tubal ligation clips were seen or palpated.   Next, the abdomen and bowel was packed away with moist lap sponges, and the bladder flap was further developed off the cervical stump and straight clamps used to clamp cut and suture ligate the cervical blood vessels.The upper vagina was then cross clamped and the cervix cut and removed from the patient. The vagina was then closed with several figure of eight sutures of 0-vicryl being sure to incorporate the uterosacral ligaments.  The bilateral fallopian tubes were then removed from the patient with clamp, cutting and suture ligation. The abdomen was then irrigated and the peritoneum oversewn at the right paraovarian operative area and at the vaginal cuff for excellent hemostasis.   The lap sponges were then  removed, and the Foley catheter was inspected and clear urine was noted. Having removed all instruments and packs, we then began closure of the abdomen. The peritoneum was closed with #2-0 Vicryl in a running continuous manner. The fascia was closed with #0 Vicryl in a running bilaterally and tied in the middle; the subcutaneous tissue was also closed with #2-0 plain gut in a running continuous manner and the skin was closed with 4-0 monocryl. Hemostasis was secured throughout the entire layers. Instrument counts were correct x 2 and the patient was taken to the recovery room in good condition and breathing independently.  An experienced assistant was required given the standard of surgical care given the complexity of the case.  This assistant was needed for exposure, dissection, suctioning, retraction, instrument exchange, assisting with delivery with administration of fundal pressure, and for overall help during the procedure.   [name] MD Attending Center for [employer] farm manager)

## 2018-10-10 NOTE — Progress Notes (Signed)
Visited patient while on morning rounds. Patient was very worried about her physical healing and current financial situation. Requested prayer and to possibly speak with Education officer, museum. Will continue to provide spiritual care as needed.  Rev. Lapeer.

## 2018-10-10 NOTE — Progress Notes (Signed)
[facility] Progress Note  2 Days Post-Op  Subjective: CC-  Patient states that she is having a lot of abdominal pain today. It worsened last night. Some nausea, no emesis. No flatus or BM. Tolerating some soft foods but does not feel that hungry. She reports mild wheezing and coughing up mucus. Denies SOB. H/o asthma.  Objective: Vital signs in last 24 hours: Temp:  [98.5 F (36.9 C)-99.6 F (37.6 C)] 99.6 F (37.6 C) (10/01 0314) Pulse Rate:  [83-96] 83 (10/01 0314) Resp:  [12-22] 22 (09/30 2007) BP: (117-172)/(51-81) 142/72 (10/01 0314) SpO2:  [91 %-100 %] 92 % (10/01 0314)    Intake/Output from previous day: 09/30 0701 - 10/01 0700 In: 120 [P.O.:120] Out: -  Intake/Output this shift: No intake/output data recorded.  PE: Gen:  Alert, NAD, pleasant HEENT: EOM's intact, pupils equal and round Pulm: trace wheezing bilaterally, +rhonchi, rate and effort normal Abd: Soft, mild distension, few BS heard, appropriately tender, lower transverse incision cdi with honeycomb in place Skin: no rashes noted, warm and dry  Lab Results:  Recent Labs    10/09/18 0108  10/09/18 2253 10/10/18 0529  WBC 14.7*  --   --  9.9  HGB 7.4*   < > 8.3* 7.8*  HCT 25.5*   < > 28.8* 27.6*  PLT 280  --   --  239   < > = values in this interval not displayed.   BMET Recent Labs    10/09/18 0108 10/10/18 0529  NA 135 139  K 4.7 3.6  CL 106 105  CO2 22 26  GLUCOSE 130* 90  BUN 7 7  CREATININE 0.79 0.87  CALCIUM 7.7* 8.4*   PT/INR No results for input(s): LABPROT, INR in the last 72 hours. CMP     Component Value Date/Time   NA 139 10/10/2018 0529   K 3.6 10/10/2018 0529   CL 105 10/10/2018 0529   CO2 26 10/10/2018 0529   GLUCOSE 90 10/10/2018 0529   BUN 7 10/10/2018 0529   CREATININE 0.87 10/10/2018 0529   CALCIUM 8.4 (L) 10/10/2018 0529   PROT 7.0 09/30/2018 1500   ALBUMIN 3.5 09/30/2018 1500   AST 14 (L) 09/30/2018 1500   ALT 13 09/30/2018 1500   ALKPHOS  64 09/30/2018 1500   BILITOT 0.5 09/30/2018 1500   GFRNONAA >60 10/10/2018 0529   GFRAA >60 10/10/2018 0529   Lipase  No results found for: LIPASE     Studies/Results: No results found.  Anti-infectives: Anti-infectives (From admission, onward)   Start     Dose/Rate Route Frequency Ordered Stop   10/08/18 1300  cefOXitin (MEFOXIN) 2 g in sodium chloride 0.9 % 100 mL IVPB     2 g 200 mL/hr over 30 Minutes Intravenous  Once 10/08/18 1239 10/08/18 1728   10/08/18 1030  cefOXitin (MEFOXIN) 2 g in sodium chloride 0.9 % 100 mL IVPB     2 g 200 mL/hr over 30 Minutes Intravenous To ShortStay Surgical 10/07/18 1333 10/08/18 1624       Assessment/Plan Asthma HTN ABL anemia - Hgb 7.8. s/p BT on 9/30  Uterine fibroid attached to distal ileum S/p Small bowel resection with anastomosis; Total abdominal hysterectomy, bilateral salpingectomy by GYN - POD#2 - no flatus or BM  ID - antibiotics periop FEN - soft diet VTE - SCDs Foley - out 9/30  Plan - Ordered duonebs, mucinex, and IS for wheezing and rhonchi. Needs to mobilize more. Will schedule tylenol and robaxin for  better pain control. Ice to incision.    LOS: 2 days    Wellington Hampshire , [facility] 10/10/2018, 8:59 AM Pager: [phone] Mon-Thurs 7:00 am-4:30 pm Fri 7:00 am -11:30 AM Sat-Sun 7:00 am-11:30 am

## 2018-10-10 NOTE — Progress Notes (Signed)
GYN Progress Note  10/10/2018 - 12:22 PM   38 y.o. POD#2 s/p TAH/bilateral salpingectomy/small bowel resection and reanastomosis by GSU (EBL 950 mL) for fibroids.   PMHx significant for HTN, BMI 40s.  24 hour/Overnight events: s/p 1U PRBC, one dose of IV dilaudid  Subjective: taking soft diet w/o issue, no fevers/chills/nausea/vomiting/chest pain/sob. No flatus yet. Voiding fine, +scant brown d/c. Rare non productive cough Just got muscle relaxant and states it feels bette.  Objective:  Current Vital Signs 24h Vital Sign Ranges  T 99.6 F (37.6 C) Temp  Avg: 98.9 F (37.2 C)  Min: 98.5 F (36.9 C)  Max: 99.6 F (37.6 C)  BP (!) 142/72 BP  Min: 117/77  Max: 172/81  HR 83 Pulse  Avg: 89.8  Min: 83  Max: 96  RR (!) 22 Resp  Avg: 16.3  Min: 12  Max: 22  SaO2 92 % Room Air SpO2  Avg: 96.4 %  Min: 91 %  Max: 100 %       24 Hour I/O Current Shift I/O  Time Ins Outs 09/30 0701 - 10/01 0700 In: 120 [P.O.:120] Out: -  No intake/output data recorded.    General: NAD, sitting in chair Abdomen: rare BS (more than yesterday. C/d/i incision.  Minimally ttp, non distended GU: no gross VB CV: S1, S2 normal, no murmur, rub or gallop, regular rate and rhythm Pulm: CTAB Extremities: no clubbing, cyanosis or edema Neuro: A&O x 3  Medications: Current Facility-Administered Medications  Medication Dose Route Frequency Provider Last Rate Last Dose  . 0.9 %  sodium chloride infusion (Manually program via Guardrails IV Fluids)   Intravenous Once Aletha Halim, MD      . acetaminophen (TYLENOL) tablet 650 mg  650 mg Oral Q6H Meuth, Brooke A, PA-C   650 mg at 10/10/18 1006  . amLODipine (NORVASC) tablet 10 mg  10 mg Oral Daily Aletha Halim, MD   10 mg at 10/10/18 1052  . Chlorhexidine Gluconate Cloth 2 % PADS 6 each  6 each Topical Daily Cornett, Marcello Moores, MD   6 each at 10/10/18 1057  . dextromethorphan-guaiFENesin (MUCINEX DM) 30-600 MG per 12 hr tablet 1 tablet  1 tablet Oral BID Meuth,  Brooke A, PA-C   1 tablet at 10/10/18 1051  . docusate sodium (COLACE) capsule 100 mg  100 mg Oral BID Meuth, Brooke A, PA-C   100 mg at 10/10/18 1052  . HYDROmorphone (DILAUDID) tablet 2-4 mg  2-4 mg Oral Q6H PRN Meuth, Brooke A, PA-C      . ipratropium-albuterol (DUONEB) 0.5-2.5 (3) MG/3ML nebulizer solution 3 mL  3 mL Nebulization Q6H PRN Meuth, Brooke A, PA-C      . lidocaine (LIDODERM) 5 % 1 patch  1 patch Transdermal Q24H Aletha Halim, MD   1 patch at 10/09/18 1831  . methocarbamol (ROBAXIN) tablet 500 mg  500 mg Oral Q8H Meuth, Brooke A, PA-C   500 mg at 10/10/18 1006  . ondansetron (ZOFRAN) tablet 4 mg  4 mg Oral Q6H PRN Aletha Halim, MD       Or  . ondansetron (ZOFRAN) injection 4 mg  4 mg Intravenous Q6H PRN Aletha Halim, MD      . oxyCODONE (Oxy IR/ROXICODONE) immediate release tablet 5 mg  5 mg Oral Q4H PRN Meuth, Brooke A, PA-C   5 mg at 10/10/18 1006  . pantoprazole (PROTONIX) EC tablet 40 mg  40 mg Oral Daily Aletha Halim, MD   40 mg at 10/10/18 1052  .  simethicone (MYLICON) chewable tablet 80 mg  80 mg Oral TID Aletha Halim, MD   80 mg at 10/10/18 1052    Laboratory: CBC Latest Ref Rng & Units 10/10/2018 10/09/2018 10/09/2018  WBC 4.0 - 10.5 K/uL 9.9 - -  Hemoglobin 12.0 - 15.0 g/dL 7.8(L) 8.3(L) 6.9(LL)  Hematocrit 36.0 - 46.0 % 27.6(L) 28.8(L) 23.6(L)  Platelets 150 - 400 K/uL 239 - -    Recent Labs  Lab 10/08/18 1456 10/09/18 0108 10/10/18 0529  NA 140 135 139  K 4.2 4.7 3.6  CL  --  106 105  CO2  --  22 26  BUN  --  7 7  CREATININE  --  0.79 0.87  CALCIUM  --  7.7* 8.4*  GLUCOSE 125* 130* 90   Radiology: None  A/P: pt stable *GYN: routine post op care. Final path benign. PT in for eval to help with ambulation.  *HTN: on norvasc, no issues *Asthma: continue current regimen.  *FEN/GI: soft diet. SLIV GSU following. Appreciate recs  *Heme: appropriate rise in H/H after 1U PRBC *PPx: SCDs, OOB today *Pain: PO PRNs.  *Dispo: likely  pod#3-4  [name] MD Attending Center for [employer] (Faculty Practice) [phone]

## 2018-10-11 MED ORDER — HYDROMORPHONE HCL 1 MG/ML IJ SOLN
1.0000 mg | INTRAMUSCULAR | Status: DC | PRN
  Administered 2018-10-11: 1 mg via INTRAVENOUS
  Filled 2018-10-11: qty 1

## 2018-10-11 MED ORDER — HYDROMORPHONE HCL 1 MG/ML IJ SOLN
1.0000 mg | INTRAMUSCULAR | Status: AC | PRN
  Administered 2018-10-12 (×2): 1 mg via INTRAVENOUS
  Filled 2018-10-11 (×2): qty 1

## 2018-10-11 MED ORDER — LIDOCAINE 5 % EX PTCH
1.0000 | MEDICATED_PATCH | CUTANEOUS | Status: DC
  Administered 2018-10-11 – 2018-10-14 (×4): 1 via TRANSDERMAL
  Filled 2018-10-11 (×4): qty 1

## 2018-10-11 MED ORDER — MOMETASONE FURO-FORMOTEROL FUM 100-5 MCG/ACT IN AERO
2.0000 | INHALATION_SPRAY | Freq: Two times a day (BID) | RESPIRATORY_TRACT | Status: DC
  Administered 2018-10-11 – 2018-10-14 (×6): 2 via RESPIRATORY_TRACT
  Filled 2018-10-11: qty 8.8

## 2018-10-11 MED ORDER — PROCHLORPERAZINE MALEATE 10 MG PO TABS
10.0000 mg | ORAL_TABLET | Freq: Four times a day (QID) | ORAL | Status: DC | PRN
  Administered 2018-10-11: 10 mg via ORAL
  Filled 2018-10-11 (×2): qty 1

## 2018-10-11 MED ORDER — METHOCARBAMOL 1000 MG/10ML IJ SOLN
500.0000 mg | Freq: Four times a day (QID) | INTRAVENOUS | Status: DC
  Administered 2018-10-11 – 2018-10-14 (×12): 500 mg via INTRAVENOUS
  Filled 2018-10-11 (×5): qty 5
  Filled 2018-10-11 (×2): qty 500
  Filled 2018-10-11: qty 5
  Filled 2018-10-11 (×3): qty 500
  Filled 2018-10-11 (×4): qty 5

## 2018-10-11 MED ORDER — OXYCODONE HCL 5 MG PO TABS
10.0000 mg | ORAL_TABLET | Freq: Four times a day (QID) | ORAL | Status: DC | PRN
  Administered 2018-10-11: 10 mg via ORAL
  Administered 2018-10-11: 15 mg via ORAL
  Administered 2018-10-12 – 2018-10-13 (×3): 10 mg via ORAL
  Administered 2018-10-13: 15 mg via ORAL
  Filled 2018-10-11: qty 2
  Filled 2018-10-11: qty 3
  Filled 2018-10-11: qty 2
  Filled 2018-10-11: qty 3
  Filled 2018-10-11 (×2): qty 2

## 2018-10-11 MED ORDER — PROCHLORPERAZINE EDISYLATE 10 MG/2ML IJ SOLN: 10.0000 mg | Freq: Four times a day (QID) | INTRAMUSCULAR | Status: DC | PRN

## 2018-10-11 NOTE — Progress Notes (Signed)
Physical Therapy Treatment Patient Details Name: Colleen Pham MRN: [id number] DOB: 1980/05/08 Today's Date: 10/11/2018    History of Present Illness Pt is a 38 y/o female s/p small bowel resection with anastomosis secondary to uterine fibroid attached to distal ileum; total abdominal hysterectomy, bilateral salpingectomy by GYN. PMH including but not limited to asthma and HTN.    PT Comments    Pt making excellent progress since previous session and tolerated ambulating a much further distance with less pain. She also tolerated stair training with demonstration and instruction from PT. Updated recommendations based on pt's progress and expected continued progress with pain better managed today. PT will continue to follow acutely.    Follow Up Recommendations  Supervision for mobility/OOB     Equipment Recommendations  Rolling walker with 5" wheels;3in1 (PT)    Recommendations for Other Services       Precautions / Restrictions Precautions Precautions: Fall Precaution Comments: abdominal incision site Restrictions Weight Bearing Restrictions: No    Mobility  Bed Mobility               General bed mobility comments: pt OOB in recliner chair upon arrival  Transfers Overall transfer level: Needs assistance Equipment used: Rolling walker (2 wheeled) Transfers: Sit to/from Stand Sit to Stand: Supervision         General transfer comment: good technique, supervision for safety  Ambulation/Gait Ambulation/Gait assistance: Min guard Gait Distance (Feet): 150 Feet Assistive device: Rolling walker (2 wheeled) Gait Pattern/deviations: Step-through pattern;Decreased stride length Gait velocity: decreased   General Gait Details: pt with greatly improved gait this session; improved upright posture and only requiring two standing rest breaks; no LOB or need for physical assistance   Stairs Stairs: Yes Stairs assistance: Min guard Stair Management: No rails;Step to  pattern;Backwards;With walker Number of Stairs: 1 General stair comments: simulated curb step to sidewalk like she has to manage at home; PT demonstrated and instructed pt in ascending/descending one step with use of RW; pt able to perform without difficulty   Wheelchair Mobility    Modified Rankin (Stroke Patients Only)       Balance Overall balance assessment: Needs assistance Sitting-balance support: Feet supported Sitting balance-Leahy Scale: Fair     Standing balance support: Single extremity supported;Bilateral upper extremity supported Standing balance-Leahy Scale: Poor                              Cognition Arousal/Alertness: Awake/alert Behavior During Therapy: Flat affect Overall Cognitive Status: Impaired/Different from baseline Area of Impairment: Problem solving                             Problem Solving: Slow processing        Exercises      General Comments        Pertinent Vitals/Pain Pain Assessment: Faces Faces Pain Scale: Hurts a little bit Pain Location: abdomen, incision site Pain Descriptors / Indicators: Guarding Pain Intervention(s): Monitored during session;Repositioned    Home Living                      Prior Function            PT Goals (current goals can now be found in the care plan section) Acute Rehab PT Goals PT Goal Formulation: With patient Time For Goal Achievement: 10/24/18 Potential to Achieve Goals: Good Progress towards PT goals:  Progressing toward goals    Frequency    Min 3X/week      PT Plan Current plan remains appropriate    Co-evaluation              AM-PAC PT "6 Clicks" Mobility   Outcome Measure  Help needed turning from your back to your side while in a flat bed without using bedrails?: A Little Help needed moving from lying on your back to sitting on the side of a flat bed without using bedrails?: A Little Help needed moving to and from a bed to a  chair (including a wheelchair)?: A Little Help needed standing up from a chair using your arms (e.g., wheelchair or bedside chair)?: A Little Help needed to walk in hospital room?: A Little Help needed climbing 3-5 steps with a railing? : A Little 6 Click Score: 18    End of Session   Activity Tolerance: Patient tolerated treatment well Patient left: in chair;with call bell/phone within reach Nurse Communication: Mobility status PT Visit Diagnosis: Other abnormalities of gait and mobility (R26.89);Pain Pain - part of body: (abdomen)     Time: 1010-1030 PT Time Calculation (min) (ACUTE ONLY): 20 min  Charges:  $Gait Training: 8-22 mins                     Sherie Don, [place], DPT  Acute Rehabilitation Services Pager [phone] Office [place] 10/11/2018, 12:05 PM

## 2018-10-11 NOTE — TOC Initial Note (Addendum)
Transition of Care [facility]) - Initial/Assessment Note    Patient Details  Name: Colleen Pham MRN: [id number] Date of Birth: [birth year]  Transition of [facility]) CM/SW Contact:    Marilu Favre, RN Phone Number: 10/11/2018, 11:27 AM  Clinical Narrative:                 Spoke to patient at bedside. Confirmed face sheet information.   Patient from home with her three children ages 37, 52, and 36 years old.   PT recommending home health PT. Called multiple agencies no one can accept due to staffing. Discussed OP physical therapy . Patient has Medicaid transportation and willing to go to OP PT at [facility]. Order entered in [place] and MD signed.   Also ordered walker and 3 in1 and asked Zack to deliver to hospital room today .   Patient asking for assistance with her [employer]. Asked permission to make a referral in [place] [facility] .   Expected Discharge Plan: Home/Self Care Barriers to Discharge: Continued Medical Work up   Patient Goals and CMS Choice Patient states their goals for this hospitalization and ongoing recovery are:: to go home CMS Medicare.gov Compare Post Acute Care list provided to:: Patient Choice offered to / list presented to : Patient  Expected Discharge Plan and Services Expected Discharge Plan: Home/Self Care   Discharge Planning Services: CM Consult Post Acute Care Choice: Home Health, Durable Medical Equipment Living arrangements for the past 2 months: Apartment Expected Discharge Date: 10/14/18               DME Arranged: 3-N-1, Walker rolling DME Agency: [employer] Date DME Agency Contacted: 10/11/18 Time DME Agency Contacted: 1121 Representative spoke with at DME Agency: [place]: NA          Prior Living Arrangements/Services Living arrangements for the past 2 months: Apartment Lives with:: Minor Children Patient language and need for interpreter reviewed:: Yes Do you feel safe going back to the place where you live?: Yes       Need for Family Participation in Patient Care: Yes (Comment) Care giver support system in place?: Yes (comment)   Criminal Activity/Legal Involvement Pertinent to Current Situation/Hospitalization: No - Comment as needed  Activities of Daily Living Home Assistive Devices/Equipment: None ADL Screening (condition at time of admission) Patient's cognitive ability adequate to safely complete daily activities?: Yes Is the patient deaf or have difficulty hearing?: No Does the patient have difficulty seeing, even when wearing glasses/contacts?: No Does the patient have difficulty concentrating, remembering, or making decisions?: No Patient able to express need for assistance with ADLs?: Yes Does the patient have difficulty dressing or bathing?: No Independently performs ADLs?: Yes (appropriate for developmental age) Does the patient have difficulty walking or climbing stairs?: No Weakness of Legs: None Weakness of Arms/Hands: None  Permission Sought/Granted   Permission granted to share information with : Yes, Verbal Permission Granted     Permission granted to share info w AGENCY: [id number]        Emotional Assessment Appearance:: Appears younger than stated age Attitude/Demeanor/Rapport: Engaged Affect (typically observed): Accepting Orientation: : Oriented to Self, Oriented to Place, Oriented to  Time, Oriented to Situation Alcohol / Substance Use: Not Applicable Psych Involvement: No (comment)  Admission diagnosis:  Fibroids Menorrhagia Patient Active Problem List   Diagnosis Date Noted  . Status post hysterectomy 10/08/2018  . Failed BTL 07/15/2018  . Anemia 07/15/2018  . Menorrhagia with regular cycle 05/28/2018  . Fibroids  05/28/2018   PCP:  Center, [place]:   [facility] [place], [place] - [address] [place] [address] Phone: [phone] Fax: [phone]     Social  Determinants of Health (SDOH) Interventions    Readmission Risk Interventions No flowsheet data found.

## 2018-10-11 NOTE — Progress Notes (Addendum)
GYN Progress Note  10/11/2018 - 7:50 AM   38 y.o. POD#3 s/p TAH/bilateral salpingectomy/small bowel resection and reanastomosis by GSU (EBL 950 mL) for fibroids.   PMHx significant for HTN, BMI 40s.  24 hour/Overnight events: nausea, no vomiting. Needed IV dilaudid  Subjective: no nausea currently, chest pain, sob fevers, chills. Pt taking some po and notes some wheezing  Objective:  Current Vital Signs 24h Vital Sign Ranges  T 98.5 F (36.9 C) Temp  Avg: 98.6 F (37 C)  Min: 98.1 F (36.7 C)  Max: 99.2 F (37.3 C)  BP 138/86 BP  Min: 138/86  Max: 144/81  HR 88 Pulse  Avg: 87.3  Min: 82  Max: 92  RR 18 Resp  Avg: 18  Min: 18  Max: 18  SaO2 95 % Room Air SpO2  Avg: 96.7 %  Min: 95 %  Max: 98 %       24 Hour I/O Current Shift I/O  Time Ins Outs 10/01 0701 - 10/02 0700 In: 220 [P.O.:220] Out: -  No intake/output data recorded.    General: NAD, sitting in chair Abdomen: rare BS (more than yesterday). C/d/i incision.  Minimally ttp, non distended GU: no gross VB CV: S1, S2 normal, no murmur, rub or gallop, regular rate and rhythm Pulm: mild b/l wheezing in lower lung fields Extremities: no clubbing, cyanosis or edema Neuro: A&O x 3  Medications: Current Facility-Administered Medications  Medication Dose Route Frequency Provider Last Rate Last Dose  . 0.9 %  sodium chloride infusion (Manually program via Guardrails IV Fluids)   Intravenous Once Aletha Halim, MD      . acetaminophen (TYLENOL) tablet 650 mg  650 mg Oral Q6H Meuth, Brooke A, PA-C   650 mg at 10/10/18 2222  . amLODipine (NORVASC) tablet 10 mg  10 mg Oral Daily Aletha Halim, MD   10 mg at 10/10/18 1052  . Chlorhexidine Gluconate Cloth 2 % PADS 6 each  6 each Topical Daily Cornett, Marcello Moores, MD   6 each at 10/10/18 1057  . dextromethorphan-guaiFENesin (MUCINEX DM) 30-600 MG per 12 hr tablet 1 tablet  1 tablet Oral BID Meuth, Brooke A, PA-C   1 tablet at 10/10/18 2222  . docusate sodium (COLACE) capsule 100 mg   100 mg Oral BID Meuth, Brooke A, PA-C   100 mg at 10/10/18 2222  . ipratropium-albuterol (DUONEB) 0.5-2.5 (3) MG/3ML nebulizer solution 3 mL  3 mL Nebulization Q6H PRN Meuth, Brooke A, PA-C      . lidocaine (LIDODERM) 5 % 1 patch  1 patch Transdermal Q24H Aletha Halim, MD      . methocarbamol (ROBAXIN) 500 mg in dextrose 5 % 50 mL IVPB  500 mg Intravenous Q6H Meuth, Brooke A, PA-C      . ondansetron (ZOFRAN) tablet 4 mg  4 mg Oral Q6H PRN Aletha Halim, MD       Or  . ondansetron (ZOFRAN) injection 4 mg  4 mg Intravenous Q6H PRN Aletha Halim, MD   4 mg at 10/11/18 0306  . oxyCODONE (Oxy IR/ROXICODONE) immediate release tablet 5 mg  5 mg Oral Q4H PRN Meuth, Brooke A, PA-C   5 mg at 10/11/18 0306  . pantoprazole (PROTONIX) EC tablet 40 mg  40 mg Oral Daily Aletha Halim, MD   40 mg at 10/10/18 1052  . simethicone (MYLICON) chewable tablet 80 mg  80 mg Oral TID Aletha Halim, MD   80 mg at 10/10/18 2221    Laboratory: No new  labs  Radiology: None  A/P: pt stable *GYN: routine post op care. Continue PT. Unfortunately, patient had non treated HTN and asthma prior to surgery with elevated BMI and lack of physical activity, so will be longer road ahead in terms of her recovery.  -Final path benign.  *HTN: on norvasc, no issues *Asthma: continue current regimen. Will add on bid maintenance inhaler.  *FEN/GI: soft diet. SLIV. Will change zofran to compazine. GSU following. Appreciate recs  *Heme: no s/s of anemia -appropriate rise in H/H after 1U PRBC *PPx: SCDs, OOB today *Pain: I increased her oxycodone to pain scale 10-15mg  q6h. rn to place lidoderm patch *Dispo: awaiting return of bowel function. Will touch base with discharge RN about PT recs from yesterday and what they will be today  [name] MD Attending Center for [facility] (Faculty Practice) [phone]

## 2018-10-11 NOTE — Progress Notes (Signed)
0355 pt c/o severe abdl pain 10/10 and nausea. Oxy 5mg  and 4mg  of Zofran already given at 0306 w/o help. Md notified and ordered dilaudid 1-2mg  q 3hrs for breakthrough of pain as she's refusing po pain med rt now. Asked if she wants to d/c po dilaudid and she said no.

## 2018-10-11 NOTE — Progress Notes (Addendum)
[facility] Progress Note  3 Days Post-Op  Subjective: CC-  Patient reports persistent nausea yesterday, no emesis. No flatus or BM. She ate a few crackers. Feels like the PO medications were making her nausea worse, although she feels a little better this morning and like she may be able to eat something. Ambulated 1.5 laps in the hall and around her room. Wheezing is improving.  Objective: Vital signs in last 24 hours: Temp:  [98.1 F (36.7 C)-99.2 F (37.3 C)] 98.5 F (36.9 C) (10/02 0410) Pulse Rate:  [82-92] 88 (10/02 0410) Resp:  [18] 18 (10/02 0410) BP: (138-144)/(81-86) 138/86 (10/02 0410) SpO2:  [95 %-98 %] 95 % (10/02 0410) Last BM Date: 10/07/18  Intake/Output from previous day: 10/01 0701 - 10/02 0700 In: 220 [P.O.:220] Out: -  Intake/Output this shift: No intake/output data recorded.  PE: Gen:  Alert, NAD, pleasant HEENT: EOM's intact, pupils equal and round Pulm: trace wheezing bilaterally, rate and effort normal Abd: Soft, mild distension, few BS heard, appropriately tender, lower transverse incision cdi with honeycomb in place Skin: no rashes noted, warm and dry  Lab Results:  Recent Labs    10/09/18 0108  10/09/18 2253 10/10/18 0529  WBC 14.7*  --   --  9.9  HGB 7.4*   < > 8.3* 7.8*  HCT 25.5*   < > 28.8* 27.6*  PLT 280  --   --  239   < > = values in this interval not displayed.   BMET Recent Labs    10/09/18 0108 10/10/18 0529  NA 135 139  K 4.7 3.6  CL 106 105  CO2 22 26  GLUCOSE 130* 90  BUN 7 7  CREATININE 0.79 0.87  CALCIUM 7.7* 8.4*   PT/INR No results for input(s): LABPROT, INR in the last 72 hours. CMP     Component Value Date/Time   NA 139 10/10/2018 0529   K 3.6 10/10/2018 0529   CL 105 10/10/2018 0529   CO2 26 10/10/2018 0529   GLUCOSE 90 10/10/2018 0529   BUN 7 10/10/2018 0529   CREATININE 0.87 10/10/2018 0529   CALCIUM 8.4 (L) 10/10/2018 0529   PROT 7.0 09/30/2018 1500   ALBUMIN 3.5 09/30/2018  1500   AST 14 (L) 09/30/2018 1500   ALT 13 09/30/2018 1500   ALKPHOS 64 09/30/2018 1500   BILITOT 0.5 09/30/2018 1500   GFRNONAA >60 10/10/2018 0529   GFRAA >60 10/10/2018 0529   Lipase  No results found for: LIPASE     Studies/Results: No results found.  Anti-infectives: Anti-infectives (From admission, onward)   Start     Dose/Rate Route Frequency Ordered Stop   10/08/18 1300  cefOXitin (MEFOXIN) 2 g in sodium chloride 0.9 % 100 mL IVPB     2 g 200 mL/hr over 30 Minutes Intravenous  Once 10/08/18 1239 10/08/18 1728   10/08/18 1030  cefOXitin (MEFOXIN) 2 g in sodium chloride 0.9 % 100 mL IVPB     2 g 200 mL/hr over 30 Minutes Intravenous To ShortStay Surgical 10/07/18 1333 10/08/18 1624       Assessment/Plan Asthma HTN ABL anemia - Hgb 7.8 (10/1), VSS. s/p blood transfusion on 9/30  Uterine fibroid attached to distal ileum S/p Small bowel resection with anastomosis; Total abdominal hysterectomy, bilateral salpingectomy by GYN 9/29 - POD#3 - persistent ileus, no flatus or BM  ID - antibiotics periop FEN - soft diet VTE - SCDs, ok to start chemical DVT prophylaxis from surgical standpoint  Foley - out 9/30  Plan - Change scheduled robaxin to IV. She is on a soft diet but not taking in much. Continue mobilizing and await return in bowel function. Give another duoneb for wheezing. Check CBC and electrolytes tomorrow.   LOS: 3 days    Wellington Hampshire , [facility] 10/11/2018, 7:48 AM Pager: [phone] Mon-Thurs 7:00 am-4:30 pm Fri 7:00 am -11:30 AM Sat-Sun 7:00 am-11:30 am

## 2018-10-11 NOTE — Plan of Care (Signed)
°  Problem: Education: °Goal: Knowledge of General Education information will improve °Description: Including pain rating scale, medication(s)/side effects and non-pharmacologic comfort measures °Outcome: Progressing °  °Problem: Activity: °Goal: Risk for activity intolerance will decrease °Outcome: Progressing °  °Problem: Elimination: °Goal: Will not experience complications related to urinary retention °Outcome: Progressing °  °

## 2018-10-12 LAB — BASIC METABOLIC PANEL
Anion gap: 12 (ref 5–15)
BUN: 6 mg/dL (ref 6–20)
CO2: 23 mmol/L (ref 22–32)
Calcium: 8.7 mg/dL — ABNORMAL LOW (ref 8.9–10.3)
Chloride: 100 mmol/L (ref 98–111)
Creatinine, Ser: 0.78 mg/dL (ref 0.44–1.00)
GFR calc Af Amer: >60 mL/min (ref >60)
GFR calc non Af Amer: >60 mL/min (ref >60)
Glucose, Bld: 103 mg/dL — ABNORMAL HIGH (ref 70–99)
Potassium: 3.5 mmol/L (ref 3.5–5.1)
Sodium: 135 mmol/L (ref 135–145)

## 2018-10-12 LAB — CBC
HCT: 28.1 % — ABNORMAL LOW (ref 36.0–46.0)
Hemoglobin: 8.3 g/dL — ABNORMAL LOW (ref 12.0–15.0)
MCH: 22.5 pg — ABNORMAL LOW (ref 26.0–34.0)
MCHC: 29.5 g/dL — ABNORMAL LOW (ref 30.0–36.0)
MCV: 76.2 fL — ABNORMAL LOW (ref 80.0–100.0)
Platelets: 294 10*3/uL (ref 150–400)
RBC: 3.69 MIL/uL — ABNORMAL LOW (ref 3.87–5.11)
RDW: 27.1 % — ABNORMAL HIGH (ref 11.5–15.5)
WBC: 9.5 10*3/uL (ref 4.0–10.5)
nRBC: 0 % (ref 0.0–0.2)

## 2018-10-12 LAB — MAGNESIUM: Magnesium: 1.7 mg/dL (ref 1.7–2.4)

## 2018-10-12 MED ORDER — ONDANSETRON 4 MG PO TBDP: 4.0000 mg | ORAL_TABLET | Freq: Three times a day (TID) | ORAL | Status: DC | PRN

## 2018-10-12 MED ORDER — POTASSIUM CHLORIDE CRYS ER 20 MEQ PO TBCR
40.0000 meq | EXTENDED_RELEASE_TABLET | Freq: Two times a day (BID) | ORAL | Status: AC
  Filled 2018-10-12 (×2): qty 2

## 2018-10-12 MED ORDER — MAGNESIUM SULFATE IN D5W 1-5 GM/100ML-% IV SOLN
1.0000 g | Freq: Once | INTRAVENOUS | Status: AC
  Administered 2018-10-12: 1 g via INTRAVENOUS
  Filled 2018-10-12: qty 100

## 2018-10-12 MED ORDER — SODIUM CHLORIDE 0.9 % IV SOLN
510.0000 mg | Freq: Once | INTRAVENOUS | Status: AC
  Administered 2018-10-12: 510 mg via INTRAVENOUS
  Filled 2018-10-12: qty 17

## 2018-10-12 MED ORDER — POLYETHYLENE GLYCOL 3350 17 G PO PACK
17.0000 g | PACK | Freq: Two times a day (BID) | ORAL | Status: DC
  Administered 2018-10-12 – 2018-10-14 (×5): 17 g via ORAL
  Filled 2018-10-12 (×5): qty 1

## 2018-10-12 MED ORDER — ENOXAPARIN SODIUM 60 MG/0.6ML ~~LOC~~ SOLN
60.0000 mg | SUBCUTANEOUS | Status: DC
  Administered 2018-10-12 – 2018-10-13 (×2): 60 mg via SUBCUTANEOUS
  Filled 2018-10-12 (×2): qty 0.6

## 2018-10-12 NOTE — Plan of Care (Signed)
  Problem: Activity: Goal: Risk for activity intolerance will decrease Outcome: Progressing   Problem: Nutrition: Goal: Adequate nutrition will be maintained Outcome: Progressing   Problem: Pain Managment: Goal: General experience of comfort will improve Outcome: Progressing   

## 2018-10-12 NOTE — Progress Notes (Signed)
    4 Days Post-Op  Subjective: CC: Patient reports nausea has improved from yesterday. She has begun having flatus. No BM. She did eat a little more yesterday, but still feels like she is not eating much. She walked 3 laps yesterday.   Objective: Vital signs in last 24 hours: Temp:  [98.6 F (37 C)-99 F (37.2 C)] 99 F (37.2 C) (10/03 0428) Pulse Rate:  [88-92] 88 (10/03 0428) Resp:  [18] 18 (10/03 0428) BP: (115-137)/(56-82) 118/71 (10/03 0428) SpO2:  [94 %-98 %] 98 % (10/03 0428) Last BM Date: 10/07/18  Intake/Output from previous day: 10/02 0701 - 10/03 0700 In: 410 [P.O.:360; IV Piggyback:50] Out: 575 [Urine:575] Intake/Output this shift: No intake/output data recorded.  PE: Gen: Awake and alert, NAD Lungs: Normal rate and effort Abd: Soft, ND, appropriately tender, +BS, honeycomb dressing in place over lower transverse incision  Lab Results:  Recent Labs    10/10/18 0529 10/12/18 0252  WBC 9.9 9.5  HGB 7.8* 8.3*  HCT 27.6* 28.1*  PLT 239 294   BMET Recent Labs    10/10/18 0529 10/12/18 0252  NA 139 135  K 3.6 3.5  CL 105 100  CO2 26 23  GLUCOSE 90 103*  BUN 7 6  CREATININE 0.87 0.78  CALCIUM 8.4* 8.7*   PT/INR No results for input(s): LABPROT, INR in the last 72 hours. CMP     Component Value Date/Time   NA 135 10/12/2018 0252   K 3.5 10/12/2018 0252   CL 100 10/12/2018 0252   CO2 23 10/12/2018 0252   GLUCOSE 103 (H) 10/12/2018 0252   BUN 6 10/12/2018 0252   CREATININE 0.78 10/12/2018 0252   CALCIUM 8.7 (L) 10/12/2018 0252   PROT 7.0 09/30/2018 1500   ALBUMIN 3.5 09/30/2018 1500   AST 14 (L) 09/30/2018 1500   ALT 13 09/30/2018 1500   ALKPHOS 64 09/30/2018 1500   BILITOT 0.5 09/30/2018 1500   GFRNONAA >60 10/12/2018 0252   GFRAA >60 10/12/2018 0252   Lipase  No results found for: LIPASE     Studies/Results: No results found.  Anti-infectives: Anti-infectives (From admission, onward)   Start     Dose/Rate Route Frequency  Ordered Stop   10/08/18 1300  cefOXitin (MEFOXIN) 2 g in sodium chloride 0.9 % 100 mL IVPB     2 g 200 mL/hr over 30 Minutes Intravenous  Once 10/08/18 1239 10/08/18 1728   10/08/18 1030  cefOXitin (MEFOXIN) 2 g in sodium chloride 0.9 % 100 mL IVPB     2 g 200 mL/hr over 30 Minutes Intravenous To [facility] 10/07/18 1333 10/08/18 1624       Assessment/Plan Asthma HTN ABL anemia - Hgb 8.3 (10/3), VSS. s/p blood transfusion on 9/30  Uterine fibroid attached to distal ileum S/pSmall bowel resection with anastomosis;Total abdominal hysterectomy, bilateral salpingectomy by GYN 9/29 -POD#4 - resolving ileus  ID -antibiotics periop FEN -soft diet VTE -SCDs, ok to start chemical DVT prophylaxis from surgical standpoint Foley -out 9/30  Plan- She has begun having bowel function (flatus). Nausea is improving. Add Zofran. Replace Electrolytes. She is on a soft diet but not taking in much. If she begins having more oral intake without N/V, she is okay for d/c from a surgical standpoint.    LOS: 4 days    Jillyn Ledger , [facility] 10/12/2018, 9:10 AM Pager: [phone]

## 2018-10-12 NOTE — Plan of Care (Signed)
  Problem: Education: Goal: Knowledge of General Education information will improve Description: Including pain rating scale, medication(s)/side effects and non-pharmacologic comfort measures Outcome: Progressing   Problem: Health Behavior/Discharge Planning: Goal: Ability to manage health-related needs will improve Outcome: Progressing   Problem: Clinical Measurements: Goal: Ability to maintain clinical measurements within normal limits will improve Outcome: Progressing Goal: Will remain free from infection Outcome: Progressing   Problem: Activity: Goal: Risk for activity intolerance will decrease Outcome: Progressing   Problem: Nutrition: Goal: Adequate nutrition will be maintained Outcome: Progressing   Problem: Elimination: Goal: Will not experience complications related to urinary retention Outcome: Progressing   Problem: Pain Managment: Goal: General experience of comfort will improve Outcome: Progressing   Problem: Safety: Goal: Ability to remain free from injury will improve Outcome: Progressing   Problem: Skin Integrity: Goal: Risk for impaired skin integrity will decrease Outcome: Progressing

## 2018-10-12 NOTE — Progress Notes (Addendum)
GYN Post Op Progress Note  10/12/2018 - 11:37 AM   38 y.o. POD#4 s/p TAH/bilateral salpingectomy/small bowel resection and reanastomosis by GSU (EBL 950 mL) for fibroids.   PMHx significant for HTN, BMI 40s, asthma  24 hour/Overnight events: starting having flatus last night  Subjective: still having flatus. Has ambulated two laps today. Drinking liquids fine and no nausea but no real appetite for foods yet; pt doesn't like having to take so many pills. Pain better controlled today. Breathing feels better. No chest pain, sob, fevers, chills.   Objective:  Current Vital Signs 24h Vital Sign Ranges  T 98.1 F (36.7 C) Temp  Avg: 98.6 F (37 C)  Min: 98.1 F (36.7 C)  Max: 99 F (37.2 C)  BP (!) 143/82 BP  Min: 115/56  Max: 143/82  HR 77 Pulse  Avg: 87  Min: 77  Max: 92  RR 18 Resp  Avg: 18  Min: 18  Max: 18  SaO2 100 % Room Air SpO2  Avg: 97.4 %  Min: 94 %  Max: 100 %       24 Hour I/O Current Shift I/O  Time Ins Outs 10/02 0701 - 10/03 0700 In: 410 [P.O.:360] Out: 575 [Urine:575] No intake/output data recorded.    General: NAD, sitting in chair Abdomen: rare BS (better than yesterday). C/d/i honeycomb dressing.  Minimally ttp, non distended. lidoderm patch in place GU: no gross VB CV: S1, S2 normal, no murmur, rub or gallop, regular rate and rhythm Pulm: ctab Extremities: no clubbing, cyanosis or edema Neuro: A&O x 3  Medications: Current Facility-Administered Medications  Medication Dose Route Frequency Provider Last Rate Last Dose  . 0.9 %  sodium chloride infusion (Manually program via Guardrails IV Fluids)   Intravenous Once Aletha Halim, MD      . acetaminophen (TYLENOL) tablet 650 mg  650 mg Oral Q6H Meuth, Brooke A, PA-C   650 mg at 10/11/18 2030  . amLODipine (NORVASC) tablet 10 mg  10 mg Oral Daily Aletha Halim, MD   10 mg at 10/11/18 0954  . Chlorhexidine Gluconate Cloth 2 % PADS 6 each  6 each Topical Daily Cornett, Marcello Moores, MD   6 each at 10/10/18 1057  .  dextromethorphan-guaiFENesin (MUCINEX DM) 30-600 MG per 12 hr tablet 1 tablet  1 tablet Oral BID Meuth, Brooke A, PA-C   1 tablet at 10/11/18 2247  . docusate sodium (COLACE) capsule 100 mg  100 mg Oral BID Meuth, Brooke A, PA-C   100 mg at 10/12/18 1047  . HYDROmorphone (DILAUDID) injection 1 mg  1 mg Intravenous Q3H PRN Aletha Halim, MD      . ipratropium-albuterol (DUONEB) 0.5-2.5 (3) MG/3ML nebulizer solution 3 mL  3 mL Nebulization Q6H PRN Meuth, Brooke A, PA-C      . lidocaine (LIDODERM) 5 % 1 patch  1 patch Transdermal Q24H Aletha Halim, MD   1 patch at 10/12/18 0807  . magnesium sulfate IVPB 1 g 100 mL  1 g Intravenous Once Maczis, Barth Kirks, PA-C      . methocarbamol (ROBAXIN) 500 mg in dextrose 5 % 50 mL IVPB  500 mg Intravenous Q6H Meuth, Brooke A, PA-C 100 mL/hr at 10/12/18 1051 500 mg at 10/12/18 1051  . mometasone-formoterol (DULERA) 100-5 MCG/ACT inhaler 2 puff  2 puff Inhalation BID Aletha Halim, MD   2 puff at 10/11/18 2105  . ondansetron (ZOFRAN-ODT) disintegrating tablet 4 mg  4 mg Oral Q8H PRN Maczis, Barth Kirks, PA-C      .  oxyCODONE (Oxy IR/ROXICODONE) immediate release tablet 10-15 mg  10-15 mg Oral Q6H PRN Aletha Halim, MD   10 mg at 10/12/18 0331  . pantoprazole (PROTONIX) EC tablet 40 mg  40 mg Oral Daily Aletha Halim, MD   40 mg at 10/10/18 1052  . potassium chloride SA (KLOR-CON) CR tablet 40 mEq  40 mEq Oral BID Jillyn Ledger, PA-C   40 mEq at 10/12/18 1047  . prochlorperazine (COMPAZINE) injection 10 mg  10 mg Intravenous Q6H PRN Aletha Halim, MD      . prochlorperazine (COMPAZINE) tablet 10 mg  10 mg Oral Q6H PRN Aletha Halim, MD   10 mg at 10/11/18 1325  . simethicone (MYLICON) chewable tablet 80 mg  80 mg Oral TID Aletha Halim, MD   80 mg at 10/12/18 1047    Laboratory: CBC Latest Ref Rng & Units 10/12/2018 10/10/2018 10/09/2018  WBC 4.0 - 10.5 K/uL 9.5 9.9 -  Hemoglobin 12.0 - 15.0 g/dL 8.3(L) 7.8(L) 8.3(L)  Hematocrit 36.0 - 46.0  % 28.1(L) 27.6(L) 28.8(L)  Platelets 150 - 400 K/uL 294 239 -   BMP Latest Ref Rng & Units 10/12/2018 10/10/2018 10/09/2018  Glucose 70 - 99 mg/dL 103(H) 90 130(H)  BUN 6 - 20 mg/dL 6 7 7   Creatinine 0.44 - 1.00 mg/dL 0.78 0.87 0.79  Sodium 135 - 145 mmol/L 135 139 135  Potassium 3.5 - 5.1 mmol/L 3.5 3.6 4.7  Chloride 98 - 111 mmol/L 100 105 106  CO2 22 - 32 mmol/L 23 26 22   Calcium 8.9 - 10.3 mg/dL 8.7(L) 8.4(L) 7.7(L)   Mg 1.7  Radiology: None  A/P: pt improving *GYN: doing much better today. PT following -Final path benign.  *HTN: on norvasc, no issues *Asthma: doing better with maintenance INH addition.  *FEN/GI: soft diet. SLIV. Will do miralax bid for less pills to take. GSU following. Appreciate recs. Kdur already ordered *Heme: no s/s of anemia. IV feraheme ordered.  -appropriate rise in H/H after 1U PRBC *PPx: start lovenox. SCDs, OOB, incentive spirometry *Pain: continue current regimen *Dispo: maybe tomorrow but more likely on Monday 10/5  [name] MD [facility] [place] (Faculty Practice) [phone]

## 2018-10-13 LAB — TYPE AND SCREEN
ABO/RH(D): O POS
Antibody Screen: NEGATIVE
Unit division: 0
Unit division: 0

## 2018-10-13 LAB — BPAM RBC
Blood Product Expiration Date: 202010272359
Blood Product Expiration Date: 202010312359
ISSUE DATE / TIME: 202009291149
ISSUE DATE / TIME: 202009301622
Unit Type and Rh: 5100
Unit Type and Rh: 5100

## 2018-10-13 MED ORDER — ENSURE ENLIVE PO LIQD
237.0000 mL | Freq: Two times a day (BID) | ORAL | Status: DC
  Administered 2018-10-13 – 2018-10-14 (×2): 237 mL via ORAL

## 2018-10-13 NOTE — Plan of Care (Signed)

## 2018-10-13 NOTE — Progress Notes (Signed)
GYN Post Op Progress Note  10/13/2018 - 12:52 PM   38 y.o. POD#5 s/p TAH/bilateral salpingectomy/small bowel resection and reanastomosis by GSU (EBL 950 mL) for fibroids.   PMHx significant for HTN, BMI 40s, asthma  24 hour/Overnight events: none  Subjective: not as much flatus as yesterday. Having more gas pains. Taking minimal PO solids (lack of appetite) but drinking fine. No chest pain, sob, fevers, chills.   Objective:  Current Vital Signs 24h Vital Sign Ranges  T 98.2 F (36.8 C) Temp  Avg: 98.4 F (36.9 C)  Min: 98.2 F (36.8 C)  Max: 98.7 F (37.1 C)  BP 128/80 BP  Min: 128/80  Max: 135/73  HR 80 Pulse  Avg: 80.7  Min: 78  Max: 84  RR 17 Resp  Avg: 16.7  Min: 16  Max: 17  SaO2 96 % Room Air SpO2  Avg: 95.4 %  Min: 94 %  Max: 97 %       24 Hour I/O Current Shift I/O  Time Ins Outs 10/03 0701 - 10/04 0700 In: 416.2  Out: 1700 [Urine:1700] No intake/output data recorded.    General: NAD, sitting in chair Abdomen: +BS. C/d/i honeycomb dressing.  Minimally ttp, non distended. lidoderm patch in place CV: S1, S2 normal, no murmur, rub or gallop, regular rate and rhythm Pulm: ctab Extremities: no clubbing, cyanosis or edema Neuro: A&O x 3  Medications: Current Facility-Administered Medications  Medication Dose Route Frequency Provider Last Rate Last Dose  . 0.9 %  sodium chloride infusion (Manually program via Guardrails IV Fluids)   Intravenous Once Aletha Halim, MD      . acetaminophen (TYLENOL) tablet 650 mg  650 mg Oral Q6H Meuth, Brooke A, PA-C   650 mg at 10/13/18 0815  . amLODipine (NORVASC) tablet 10 mg  10 mg Oral Daily Aletha Halim, MD   10 mg at 10/13/18 1110  . Chlorhexidine Gluconate Cloth 2 % PADS 6 each  6 each Topical Daily Cornett, Marcello Moores, MD   6 each at 10/10/18 1057  . dextromethorphan-guaiFENesin (MUCINEX DM) 30-600 MG per 12 hr tablet 1 tablet  1 tablet Oral BID Meuth, Brooke A, PA-C   1 tablet at 10/11/18 2247  . enoxaparin (LOVENOX) injection  60 mg  60 mg Subcutaneous Q24H Aletha Halim, MD   60 mg at 10/13/18 1110  . ipratropium-albuterol (DUONEB) 0.5-2.5 (3) MG/3ML nebulizer solution 3 mL  3 mL Nebulization Q6H PRN Meuth, Brooke A, PA-C      . lidocaine (LIDODERM) 5 % 1 patch  1 patch Transdermal Q24H Aletha Halim, MD   1 patch at 10/13/18 0814  . methocarbamol (ROBAXIN) 500 mg in dextrose 5 % 50 mL IVPB  500 mg Intravenous Q6H Meuth, Brooke A, PA-C 100 mL/hr at 10/13/18 1111 500 mg at 10/13/18 1111  . mometasone-formoterol (DULERA) 100-5 MCG/ACT inhaler 2 puff  2 puff Inhalation BID Aletha Halim, MD   2 puff at 10/13/18 0818  . ondansetron (ZOFRAN-ODT) disintegrating tablet 4 mg  4 mg Oral Q8H PRN Maczis, Barth Kirks, PA-C      . oxyCODONE (Oxy IR/ROXICODONE) immediate release tablet 10-15 mg  10-15 mg Oral Q6H PRN Aletha Halim, MD   15 mg at 10/13/18 0305  . pantoprazole (PROTONIX) EC tablet 40 mg  40 mg Oral Daily Aletha Halim, MD   40 mg at 10/13/18 1110  . polyethylene glycol (MIRALAX / GLYCOLAX) packet 17 g  17 g Oral BID Aletha Halim, MD   17 g at  10/13/18 1110  . prochlorperazine (COMPAZINE) injection 10 mg  10 mg Intravenous Q6H PRN Aletha Halim, MD      . prochlorperazine (COMPAZINE) tablet 10 mg  10 mg Oral Q6H PRN Aletha Halim, MD   10 mg at 10/11/18 1325  . simethicone (MYLICON) chewable tablet 80 mg  80 mg Oral TID Aletha Halim, MD   80 mg at 10/13/18 1110    Laboratory: No new labs  Radiology: None  A/P: pt stable *GYN: PT following.  -Final path benign.  *HTN: on norvasc, no issues *Asthma: doing better with maintenance INH addition.  *FEN/GI: soft diet. Ensure drinks ordered SLIV.  GSU following. Appreciate recs.  *Heme: no s/s of anemia. IV feraheme ordered.  -appropriate rise in H/H after 1U PRBC *PPx: lovenox, SCDs, OOB, incentive spirometry *Pain: continue current regimen *Dispo: hopefully tomorrow.   [name] MD Attending [facility] for [facility]  (Faculty Practice) [phone]

## 2018-10-13 NOTE — Progress Notes (Signed)
Patient ID: Colleen Pham, female   DOB: 09-14-1980, 38 y.o.   MRN: [id number] [facility] Progress Note:   5 Days Post-Op  Subjective: Mental status is clear;  Minimal flatus and no appetite Objective: Vital signs in last 24 hours: Temp:  [98.1 F (36.7 C)-98.7 F (37.1 C)] 98.2 F (36.8 C) (10/04 0443) Pulse Rate:  [77-84] 80 (10/04 0443) Resp:  [16-18] 17 (10/04 0443) BP: (128-143)/(73-83) 128/80 (10/04 0443) SpO2:  [94 %-100 %] 95 % (10/04 0443)  Intake/Output from previous day: 10/03 0701 - 10/04 0700 In: 416.2 [IV Piggyback:416.2] Out: 1700 [Urine:1700] Intake/Output this shift: No intake/output data recorded.  Physical Exam: Work of breathing is normal.  Abdomen is sore  Lab Results:  Results for orders placed or performed during the hospital encounter of 10/08/18 (from the past 48 hour(s))  CBC     Status: Abnormal   Collection Time: 10/12/18  2:52 AM  Result Value Ref Range   WBC 9.5 4.0 - 10.5 K/uL   RBC 3.69 (L) 3.87 - 5.11 MIL/uL   Hemoglobin 8.3 (L) 12.0 - 15.0 g/dL    Comment: Reticulocyte Hemoglobin testing may be clinically indicated, consider ordering this additional test [id number]    HCT 28.1 (L) 36.0 - 46.0 %   MCV 76.2 (L) 80.0 - 100.0 fL   MCH 22.5 (L) 26.0 - 34.0 pg   MCHC 29.5 (L) 30.0 - 36.0 g/dL   RDW 27.1 (H) 11.5 - 15.5 %   Platelets 294 150 - 400 K/uL   nRBC 0.0 0.0 - 0.2 %    Comment: Performed at [facility], [place] [address], [place], [place] [id number]  Basic metabolic panel     Status: Abnormal   Collection Time: 10/12/18  2:52 AM  Result Value Ref Range   Sodium 135 135 - 145 mmol/L   Potassium 3.5 3.5 - 5.1 mmol/L   Chloride 100 98 - 111 mmol/L   CO2 23 22 - 32 mmol/L   Glucose, Bld 103 (H) 70 - 99 mg/dL   BUN 6 6 - 20 mg/dL   Creatinine, Ser 0.78 0.44 - 1.00 mg/dL   Calcium 8.7 (L) 8.9 - 10.3 mg/dL   GFR calc non Af Amer >60 >60 mL/min   GFR calc Af Amer >60 >60 mL/min   Anion gap 12 5 - 15    Comment:  Performed at [facility], [place] [address] [place], [place] [address]  Magnesium     Status: None   Collection Time: 10/12/18  2:52 AM  Result Value Ref Range   Magnesium 1.7 1.7 - 2.4 mg/dL    Comment: Performed at [facility] [address] [place], [place] [address]    Radiology/Results: No results found.  Anti-infectives: Anti-infectives (From admission, onward)   Start     Dose/Rate Route Frequency Ordered Stop   10/08/18 1300  cefOXitin (MEFOXIN) 2 g in sodium chloride 0.9 % 100 mL IVPB     2 g 200 mL/hr over 30 Minutes Intravenous  Once 10/08/18 1239 10/08/18 1728   10/08/18 1030  cefOXitin (MEFOXIN) 2 g in sodium chloride 0.9 % 100 mL IVPB     2 g 200 mL/hr over 30 Minutes Intravenous To [facility] 10/07/18 1333 10/08/18 1624      Assessment/Plan: Problem List: Patient Active Problem List   Diagnosis Date Noted  . Status post hysterectomy 10/08/2018  . Failed BTL 07/15/2018  . Anemia 07/15/2018  . Menorrhagia  with regular cycle 05/28/2018  . Fibroids 05/28/2018    Resolving ileus after small bowel resection.   5 Days Post-Op    LOS: 5 days   Matt B. Hassell Done, MD, [facility] [phone] beeper [phone]  10/13/2018 8:12 AM

## 2018-10-14 LAB — BASIC METABOLIC PANEL
Anion gap: 11 (ref 5–15)
BUN: 5 mg/dL — ABNORMAL LOW (ref 6–20)
CO2: 25 mmol/L (ref 22–32)
Calcium: 9.2 mg/dL (ref 8.9–10.3)
Chloride: 101 mmol/L (ref 98–111)
Creatinine, Ser: 0.81 mg/dL (ref 0.44–1.00)
GFR calc Af Amer: >60 mL/min (ref >60)
GFR calc non Af Amer: >60 mL/min (ref >60)
Glucose, Bld: 96 mg/dL (ref 70–99)
Potassium: 3.5 mmol/L (ref 3.5–5.1)
Sodium: 137 mmol/L (ref 135–145)

## 2018-10-14 MED ORDER — POLYETHYLENE GLYCOL 3350 17 G PO PACK: 17.0000 g | PACK | Freq: Two times a day (BID) | ORAL | 0 refills | Status: DC

## 2018-10-14 MED ORDER — ACETAMINOPHEN 325 MG PO TABS: 650.0000 mg | ORAL_TABLET | Freq: Four times a day (QID) | ORAL | 0 refills | Status: AC | PRN

## 2018-10-14 MED ORDER — PROCHLORPERAZINE MALEATE 10 MG PO TABS: 10.0000 mg | ORAL_TABLET | Freq: Four times a day (QID) | ORAL | 0 refills | Status: AC | PRN

## 2018-10-14 MED ORDER — DM-GUAIFENESIN ER 30-600 MG PO TB12: 1.0000 | ORAL_TABLET | Freq: Two times a day (BID) | ORAL | 1 refills | Status: AC | PRN

## 2018-10-14 MED ORDER — HEATING PAD PADS: 1.0000 [IU] | MEDICATED_PAD | 0 refills | Status: AC | PRN

## 2018-10-14 MED ORDER — AMLODIPINE BESYLATE 10 MG PO TABS: 10.0000 mg | ORAL_TABLET | Freq: Every day | ORAL | 2 refills | Status: AC

## 2018-10-14 MED ORDER — OXYCODONE HCL 10 MG PO TABS: 10.0000 mg | ORAL_TABLET | Freq: Four times a day (QID) | ORAL | 0 refills | Status: DC | PRN

## 2018-10-14 MED ORDER — MOMETASONE FURO-FORMOTEROL FUM 100-5 MCG/ACT IN AERO: 2.0000 | INHALATION_SPRAY | Freq: Two times a day (BID) | RESPIRATORY_TRACT | 2 refills | Status: AC

## 2018-10-14 MED ORDER — SIMETHICONE 80 MG PO CHEW: 80.0000 mg | CHEWABLE_TABLET | Freq: Two times a day (BID) | ORAL | 0 refills | Status: AC

## 2018-10-14 MED ORDER — PANTOPRAZOLE SODIUM 40 MG PO TBEC: 40.0000 mg | DELAYED_RELEASE_TABLET | Freq: Every day | ORAL | 0 refills | Status: AC

## 2018-10-14 MED FILL — POLYETHYLENE GLYCOL 3350 PO: 17 | 7 days supply | Qty: 238 | Fill #0

## 2018-10-14 MED FILL — SIMETHICONE 80 MG CHEW: 80 | 14 days supply | Qty: 28 | Fill #0

## 2018-10-14 MED FILL — PROCHLORPERAZINE 10 MG TAB: 10 | 8 days supply | Qty: 30 | Fill #0

## 2018-10-14 MED FILL — DULERA 100 MCG/5 MCG INH: 100-5 | 30 days supply | Qty: 13 | Fill #0

## 2018-10-14 MED FILL — oxyCODONE HCL 10 MG TABS: 10 | 6 days supply | Qty: 35 | Fill #0

## 2018-10-14 MED FILL — AMLODIPINE BESYLATE 10 MG T: 10 | 60 days supply | Qty: 60 | Fill #0

## 2018-10-14 MED FILL — ACETAMINOPHEN 325 MG TABS: 325 | 6 days supply | Qty: 45 | Fill #0

## 2018-10-14 MED FILL — PANTOPRAZOLE SOD DR 40 MG T: 40 | 30 days supply | Qty: 30 | Fill #0

## 2018-10-14 NOTE — Discharge Instructions (Signed)
[facility]      [facility] °[phone] ° °OPEN ABDOMINAL SURGERY: POST OP INSTRUCTIONS ° °Always review your discharge instruction sheet given to you by the facility where your surgery was performed. ° °IF YOU HAVE DISABILITY OR FAMILY LEAVE FORMS, YOU MUST BRING THEM TO THE OFFICE FOR PROCESSING.  PLEASE DO NOT GIVE THEM TO YOUR DOCTOR. ° °1. A prescription for pain medication may be given to you upon discharge.  Take your pain medication as prescribed, if needed.  If narcotic pain medicine is not needed, then you may take acetaminophen (Tylenol) or ibuprofen (Advil) as needed. °2. Take your usually prescribed medications unless otherwise directed. °3. If you need a refill on your pain medication, please contact your pharmacy. They will contact our office to request authorization.  Prescriptions will not be filled after 5pm or on week-ends. °4. You should follow a light diet the first few days after arrival home, such as soup and crackers, pudding, etc.unless your doctor has advised otherwise. A high-fiber, low fat diet can be resumed as tolerated.   Be sure to include lots of fluids daily. Most patients will experience some swelling and bruising on the chest and neck area.  Ice packs will help.  Swelling and bruising can take several days to resolve °5. Most patients will experience some swelling and bruising in the area of the incision. Ice pack will help. Swelling and bruising can take several days to resolve..  °6. It is common to experience some constipation if taking pain medication after surgery.  Increasing fluid intake and taking a stool softener will usually help or prevent this problem from occurring.  A mild laxative (Milk of Magnesia or Miralax) should be taken according to package directions if there are no bowel movements after 48 hours. °7.  You may have steri-strips (small skin tapes) in place directly over the incision.  These strips should be left on the skin for 7-10 days.  If your  surgeon used skin glue on the incision, you may shower in 24 hours.  The glue will flake off over the next 2-3 weeks.  Any sutures or staples will be removed at the office during your follow-up visit. You may find that a light gauze bandage over your incision may keep your staples from being rubbed or pulled. You may shower and replace the bandage daily. °8. ACTIVITIES:  You may resume regular (light) daily activities beginning the next day--such as daily self-care, walking, climbing stairs--gradually increasing activities as tolerated.  You may have sexual intercourse when it is comfortable.  Refrain from any heavy lifting or straining until approved by your doctor. °a. You may drive when you no longer are taking prescription pain medication, you can comfortably wear a seatbelt, and you can safely maneuver your car and apply brakes °b. Return to Work: ___________________________________ °9. You should see your doctor in the office for a follow-up appointment approximately two weeks after your surgery.  Make sure that you call for this appointment within a day or two after you arrive home to insure a convenient appointment time. °OTHER INSTRUCTIONS:  °_____________________________________________________________ °_____________________________________________________________ ° °WHEN TO CALL YOUR DOCTOR: °1. Fever over 101.0 °2. Inability to urinate °3. Nausea and/or vomiting °4. Extreme swelling or bruising °5. Continued bleeding from incision. °6. Increased pain, redness, or drainage from the incision. °7. Difficulty swallowing or breathing °8. Muscle cramping or spasms. °9. Numbness or tingling in hands or feet or around lips. ° °The clinic staff is available to   answer your questions during regular business hours.  Please dont hesitate to call and ask to speak to one of the nurses if you have concerns.  For further questions, please visit [email or web address]   Laparoscopic Surgery Discharge  Instructions  Instructions Following Major Surgery You have just undergone a major surgery.  The following list should answer your most common questions.  Although we will discuss your surgery and post-operative instructions with you prior to your discharge, this list will serve as a reminder if you fail to recall the details of what we discussed.  We will discuss your surgery once again in detail at your post-op visit in two to four weeks. If you havent already done so, please call to make your appointment as soon as possible.  How you will feel: Although you have just undergone a major surgery, your recovery will be significantly shorter since the surgery was performed through much smaller incisions than the traditional approach.  You should feel slightly better each day.  If you suddenly feel much worse than the prior day, please call the clinic.  Its important during the early part of your recovery that you maintain some activity.  Walking is encouraged.  You will quicken your recovery by continued activity.  Incision:  Your incisions was closed with dissolvable stitches Please inform us if the redness at the edges of your incision appears to be spreading.  If the skin around your incision becomes warm to the touch, or if you notice a pus-like drainage, please call the office.  Vaginal Discharge Following a Hysterectomy: Minor vaginal bleeding or spotting is normal following a hysterectomy.  Bleeding similar to the amount of your period is excessive, and you should inform us of this immediately.  Vaginal spotting may continue for several weeks following your surgery.  You may notice a yellowish discharge which occasionally occurs as the vaginal stitches dissolve, and may last for several weeks.  Sexual Activity Following a Hysterectomy: Do not have sexual intercourse or place tampons or douches in the vagina prior to your first office visit.  We will discuss when you may resume these activities at  that visit.    Stairs/Driving/Activities: You may climb stairs if necessary.  If youve had general anesthesia, do not drive a car the rest of the day today.  You may begin light housework when you feel up to it, but avoid heavy lifting (more than 15-20lbs) or pushing until cleared for these activities by your physician.  Hygiene:  Do not soak your incisions.  Showers are acceptable but you may not take a bath or swim in a pool.  Cleanse your incisions daily with soap and water.  Medications:  Please resume taking any medications that you were taking prior to the surgery.  If we have prescribed any new medications for you, please take them as directed.  Constipation:  It is fairly common to experience some difficulty in moving your bowels following major surgery.  Being active will help to reduce this likelihood. A diet rich in fiber and plenty of liquids is desirable.  If you do become constipated, a mild laxative such as Miralax, Milk of Magnesia, or Metamucil, or a stool softener such as Colace, is recommended.  General Instructions: If you develop a fever of 100.5 degrees or higher, please call the office number(s) below for physician on call.
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
## 2018-10-14 NOTE — Progress Notes (Signed)
[name] to be D/C'd  per MD order. Discussed with the patient and all questions fully answered.CM addressed about needs , TOC aware of home meds.  VSS, Skin clean, dry and intact without evidence of skin break down, no evidence of skin tears noted.  IV catheter discontinued intact. Site without signs and symptoms of complications. Dressing and pressure applied.  An After Visit Summary was printed and given to the patient. Patient received prescription.  D/c education completed with patient/family including follow up instructions, medication list, d/c activities limitations if indicated, with other d/c instructions as indicated by MD - patient able to verbalize understanding, all questions fully answered.   Patient instructed to return to ED, call 911, or call MD for any changes in condition.   Patient to be escorted via [place], and D/C home via private auto.

## 2018-10-14 NOTE — Progress Notes (Signed)
[facility]/Trauma Progress Note  6 Days Post-Op   Assessment/Plan Asthma HTN ABL anemia - per primary, stable   Uterine fibroid attached to distal ileum S/pSmall bowel resection with anastomosis; Dr. Nilda Calamity abdominal hysterectomy, bilateral salpingectomy Dr. Chip Boer -POD#6 -ileus has resolved, having flatus   ID -antibiotics periop FEN -soft diet VTE -SCDs, lovenox Foley -out 9/30  Plan-she is okay for d/c from a surgical standpoint. will put follow up in AVS   LOS: 6 days    Subjective: CC: crampy abdominal pain  Pt states she is having flatus but no BM. No nausea or vomiting. She has intermittent crampy abdominal pains she thinks is gas. Overall her pain is improved and she is feeling better. She states she is needing a walker to ambulate 2/2 pain.   Objective: Vital signs in last 24 hours: Temp:  [98.6 F (37 C)-99.1 F (37.3 C)] 98.6 F (37 C) (10/05 0426) Pulse Rate:  [77-89] 80 (10/05 0809) Resp:  [18] 18 (10/05 0809) BP: (119-148)/(73-85) 119/73 (10/05 0426) SpO2:  [98 %-100 %] 98 % (10/05 0809) Last BM Date: 10/07/18  Intake/Output from previous day: 10/04 0701 - 10/05 0700 In: 300 [P.O.:300] Out: 1400 [Urine:1400] Intake/Output this shift: No intake/output data recorded.  PE: Gen:  Alert, NAD, pleasant, cooperative Pulm:  Rate and effort normal Abd: Soft, ND, +BS, incisions C/D/I, mild generalized TTP worse in the suprapubic region and periumbilical region. No peritonitis  Skin: no rashes noted, warm and dry   Anti-infectives: Anti-infectives (From admission, onward)   Start     Dose/Rate Route Frequency Ordered Stop   10/08/18 1300  cefOXitin (MEFOXIN) 2 g in sodium chloride 0.9 % 100 mL IVPB     2 g 200 mL/hr over 30 Minutes Intravenous  Once 10/08/18 1239 10/08/18 1728   10/08/18 1030  cefOXitin (MEFOXIN) 2 g in sodium chloride 0.9 % 100 mL IVPB     2 g 200 mL/hr over 30 Minutes Intravenous To [facility]  Surgical 10/07/18 1333 10/08/18 1624      Lab Results:  Recent Labs    10/12/18 0252  WBC 9.5  HGB 8.3*  HCT 28.1*  PLT 294   BMET Recent Labs    10/12/18 0252 10/14/18 0457  NA 135 137  K 3.5 3.5  CL 100 101  CO2 23 25  GLUCOSE 103* 96  BUN 6 5*  CREATININE 0.78 0.81  CALCIUM 8.7* 9.2   PT/INR No results for input(s): LABPROT, INR in the last 72 hours. CMP     Component Value Date/Time   NA 137 10/14/2018 0457   K 3.5 10/14/2018 0457   CL 101 10/14/2018 0457   CO2 25 10/14/2018 0457   GLUCOSE 96 10/14/2018 0457   BUN 5 (L) 10/14/2018 0457   CREATININE 0.81 10/14/2018 0457   CALCIUM 9.2 10/14/2018 0457   PROT 7.0 09/30/2018 1500   ALBUMIN 3.5 09/30/2018 1500   AST 14 (L) 09/30/2018 1500   ALT 13 09/30/2018 1500   ALKPHOS 64 09/30/2018 1500   BILITOT 0.5 09/30/2018 1500   GFRNONAA >60 10/14/2018 0457   GFRAA >60 10/14/2018 0457   Lipase  No results found for: LIPASE  Studies/Results: No results found.   Kalman Drape, [facility] Surgery Pager [phone] Cristine Polio, & Friday 7:00am - 4:30pm Thursdays 7:00am -11:30am

## 2018-10-22 ENCOUNTER — Ambulatory Visit: Payer: Medicaid Other | Admitting: Obstetrics and Gynecology

## 2018-10-31 ENCOUNTER — Encounter: Payer: Self-pay | Admitting: Obstetrics and Gynecology

## 2018-10-31 ENCOUNTER — Ambulatory Visit (INDEPENDENT_AMBULATORY_CARE_PROVIDER_SITE_OTHER): Payer: Medicaid Other | Admitting: Obstetrics and Gynecology

## 2018-10-31 ENCOUNTER — Other Ambulatory Visit: Payer: Self-pay

## 2018-10-31 VITALS — BP 154/102 | HR 73 | Ht 62.0 in | Wt 264.8 lb

## 2018-10-31 DIAGNOSIS — Z9889 Other specified postprocedural states: Secondary | ICD-10-CM

## 2018-10-31 DIAGNOSIS — Z09 Encounter for follow-up examination after completed treatment for conditions other than malignant neoplasm: Secondary | ICD-10-CM

## 2018-10-31 MED ORDER — OXYCODONE HCL 10 MG PO TABS: 10.0000 mg | ORAL_TABLET | Freq: Four times a day (QID) | ORAL | 0 refills | Status: AC | PRN

## 2018-10-31 MED ORDER — POLYETHYLENE GLYCOL 3350 17 G PO PACK: 17.0000 g | PACK | Freq: Two times a day (BID) | ORAL | 0 refills | Status: AC

## 2018-10-31 NOTE — Progress Notes (Addendum)
Obstetrics and Gynecology Visit Return Patient Evaluation  Appointment Date: 10/31/2018  Primary Care Provider: [facility] [place] [facility] [place]  Chief Complaint: incision check  History of Present Illness:  Colleen Pham is a 38 y.o. s/p 9/29 Total abdominal hysterectomy, bilateral salpingectomy by GYN and small bowel resection and anastomosis by Dr. Brantley Stage of General Surgery for fibroid uterus.  Patient discharged to home on 10/5.  Interval History: Since that time, she states that she is still a little sore and needing an oxycodone qhs but is otherwise doing well, including moving her books w/o issue.   Review of Systems: as noted in the History of Present Illness.  Medications:  [name] had no medications administered during this visit. Current Outpatient Medications  Medication Sig Dispense Refill  . amLODipine (NORVASC) 10 MG tablet Take 1 tablet (10 mg total) by mouth daily. 60 tablet 2  . Heating Pad PADS 1 Units by Does not apply route as needed. 1 each 0  . polyethylene glycol (MIRALAX / GLYCOLAX) 17 g packet Take 17 g by mouth 2 (two) times daily. 60 packet 0  . Oxycodone HCl 10 MG TABS Take 1-1.5 tablets (10-15 mg total) by mouth every 6 (six) hours as needed. 20 tablet 0   No current facility-administered medications for this visit.     Allergies: has No Known Allergies.  Physical Exam:  BP (!) 154/102   Pulse 73   Ht 5\' 2"  (1.575 m)   Wt 264 lb 12.8 oz (120.1 kg)   LMP 10/05/2018   BMI 48.43 kg/m  Body mass index is 48.43 kg/m. General appearance: Well nourished, well developed female in no acute distress.  Abdomen: diffusely non tender to palpation, non distended, and no masses, hernias. Slight suture tail at the right lateral aspect but incision is c/d/i Neuro/Psych:  Normal mood and affect.     Assessment: pt doing well  Plan:  1. Postop check Routine care. Recommend continuing on  precautions until regular post op check in a few weeks  Refill on oxycodone and miralax sent in  Recommended patient call PCP at [facility] given started on norvasc and inhaler while inpatient s/p hysterectomy   RTC: 2-3wks  [name] MD Attending Center for [facility])

## 2018-10-31 NOTE — Progress Notes (Signed)
Pt. States she has pain in her pelvic area and lower back. Pt. States this pain has been going on for a "few weeks now.

## 2018-11-28 ENCOUNTER — Encounter: Payer: Self-pay | Admitting: Obstetrics and Gynecology

## 2018-11-28 ENCOUNTER — Ambulatory Visit (INDEPENDENT_AMBULATORY_CARE_PROVIDER_SITE_OTHER): Payer: Medicaid Other | Admitting: Obstetrics and Gynecology

## 2018-11-28 ENCOUNTER — Other Ambulatory Visit: Payer: Self-pay

## 2018-11-28 VITALS — BP 162/96 | HR 76 | Wt 270.0 lb

## 2018-11-28 DIAGNOSIS — Z09 Encounter for follow-up examination after completed treatment for conditions other than malignant neoplasm: Secondary | ICD-10-CM

## 2018-11-28 DIAGNOSIS — Z9889 Other specified postprocedural states: Secondary | ICD-10-CM

## 2018-11-28 NOTE — Progress Notes (Signed)
Pt states she has not been taking her BP meds for the last few days.
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
## 2019-08-18 ENCOUNTER — Emergency Department (HOSPITAL_COMMUNITY)
Admission: EM | Admit: 2019-08-18 | Discharge: 2019-08-19 | Disposition: A | Discharge status: Home / Self Care | Payer: Medicaid Other | Attending: Emergency Medicine | Admitting: Emergency Medicine

## 2019-08-18 ENCOUNTER — Other Ambulatory Visit: Payer: Self-pay

## 2019-08-18 ENCOUNTER — Encounter (HOSPITAL_COMMUNITY): Payer: Self-pay

## 2019-08-18 DIAGNOSIS — R1012 Left upper quadrant pain: Secondary | ICD-10-CM | POA: Diagnosis not present

## 2019-08-18 DIAGNOSIS — Z5321 Procedure and treatment not carried out due to patient leaving prior to being seen by health care provider: Secondary | ICD-10-CM | POA: Diagnosis not present

## 2019-08-18 DIAGNOSIS — R1011 Right upper quadrant pain: Secondary | ICD-10-CM | POA: Insufficient documentation

## 2019-08-18 DIAGNOSIS — R110 Nausea: Secondary | ICD-10-CM | POA: Diagnosis not present

## 2019-08-18 LAB — CBC
HCT: 44.1 % (ref 36.0–46.0)
Hemoglobin: 13.3 g/dL (ref 12.0–15.0)
MCH: 25.1 pg — ABNORMAL LOW (ref 26.0–34.0)
MCHC: 30.2 g/dL (ref 30.0–36.0)
MCV: 83.4 fL (ref 80.0–100.0)
Platelets: 340 10*3/uL (ref 150–400)
RBC: 5.29 MIL/uL — ABNORMAL HIGH (ref 3.87–5.11)
RDW: 17.8 % — ABNORMAL HIGH (ref 11.5–15.5)
WBC: 9.6 10*3/uL (ref 4.0–10.5)
nRBC: 0 % (ref 0.0–0.2)

## 2019-08-18 LAB — I-STAT BETA HCG BLOOD, ED (MC, WL, AP ONLY): I-stat hCG, quantitative: <5 m[IU]/mL (ref <5)

## 2019-08-18 LAB — LIPASE, BLOOD: Lipase: 23 U/L (ref 11–51)

## 2019-08-18 NOTE — ED Notes (Signed)
Lab called need recollect on [place].

## 2019-08-18 NOTE — ED Triage Notes (Signed)
Pt is coming in complaining of abd pain in the center upper quadrants. Pt stated it began last night and has increased since then. Endorses nausea but no emesis or diarrhea.

## 2019-08-19 NOTE — ED Notes (Signed)
No answer for recheck of V/S x1

## 2022-07-25 ENCOUNTER — Emergency Department (HOSPITAL_COMMUNITY)
Admission: EM | Admit: 2022-07-25 | Discharge: 2022-07-25 | Disposition: A | Discharge status: Home / Self Care | Payer: Medicaid Other | Attending: Emergency Medicine | Admitting: Emergency Medicine

## 2022-07-25 ENCOUNTER — Emergency Department (HOSPITAL_BASED_OUTPATIENT_CLINIC_OR_DEPARTMENT_OTHER)
Admit: 2022-07-25 | Discharge: 2022-07-25 | Disposition: A | Discharge status: Home / Self Care | Payer: Medicaid Other | Attending: Emergency Medicine | Admitting: Emergency Medicine

## 2022-07-25 ENCOUNTER — Emergency Department (HOSPITAL_COMMUNITY): Payer: Medicaid Other

## 2022-07-25 ENCOUNTER — Other Ambulatory Visit: Payer: Self-pay

## 2022-07-25 DIAGNOSIS — R609 Edema, unspecified: Secondary | ICD-10-CM | POA: Diagnosis not present

## 2022-07-25 DIAGNOSIS — R110 Nausea: Secondary | ICD-10-CM | POA: Diagnosis not present

## 2022-07-25 DIAGNOSIS — R2243 Localized swelling, mass and lump, lower limb, bilateral: Secondary | ICD-10-CM | POA: Diagnosis not present

## 2022-07-25 DIAGNOSIS — R1032 Left lower quadrant pain: Secondary | ICD-10-CM | POA: Diagnosis not present

## 2022-07-25 DIAGNOSIS — R102 Pelvic and perineal pain: Secondary | ICD-10-CM | POA: Insufficient documentation

## 2022-07-25 LAB — URINALYSIS, ROUTINE W REFLEX MICROSCOPIC
Bilirubin Urine: NEGATIVE
Glucose, UA: NEGATIVE mg/dL
Hgb urine dipstick: NEGATIVE
Ketones, ur: NEGATIVE mg/dL
Leukocytes,Ua: NEGATIVE
Nitrite: NEGATIVE
Protein, ur: NEGATIVE mg/dL
Specific Gravity, Urine: 1.011 (ref 1.005–1.030)
pH: 7 (ref 5.0–8.0)

## 2022-07-25 LAB — BASIC METABOLIC PANEL
Anion gap: 8 (ref 5–15)
BUN: 8 mg/dL (ref 6–20)
CO2: 26 mmol/L (ref 22–32)
Calcium: 8.3 mg/dL — ABNORMAL LOW (ref 8.9–10.3)
Chloride: 102 mmol/L (ref 98–111)
Creatinine, Ser: 0.84 mg/dL (ref 0.44–1.00)
GFR, Estimated: >60 mL/min (ref >60)
Glucose, Bld: 88 mg/dL (ref 70–99)
Potassium: 3.7 mmol/L (ref 3.5–5.1)
Sodium: 136 mmol/L (ref 135–145)

## 2022-07-25 LAB — CBC WITH DIFFERENTIAL/PLATELET
Abs Immature Granulocytes: 0.02 10*3/uL (ref 0.00–0.07)
Basophils Absolute: 0 10*3/uL (ref 0.0–0.1)
Basophils Relative: 0 %
Eosinophils Absolute: 0.2 10*3/uL (ref 0.0–0.5)
Eosinophils Relative: 3 %
HCT: 38.4 % (ref 36.0–46.0)
Hemoglobin: 11.8 g/dL — ABNORMAL LOW (ref 12.0–15.0)
Immature Granulocytes: 0 %
Lymphocytes Relative: 34 %
Lymphs Abs: 2.4 10*3/uL (ref 0.7–4.0)
MCH: 25.9 pg — ABNORMAL LOW (ref 26.0–34.0)
MCHC: 30.7 g/dL (ref 30.0–36.0)
MCV: 84.2 fL (ref 80.0–100.0)
Monocytes Absolute: 0.4 10*3/uL (ref 0.1–1.0)
Monocytes Relative: 6 %
Neutro Abs: 4.1 10*3/uL (ref 1.7–7.7)
Neutrophils Relative %: 57 %
Platelets: 254 10*3/uL (ref 150–400)
RBC: 4.56 MIL/uL (ref 3.87–5.11)
RDW: 17.4 % — ABNORMAL HIGH (ref 11.5–15.5)
WBC: 7.2 10*3/uL (ref 4.0–10.5)
nRBC: 0 % (ref 0.0–0.2)

## 2022-07-25 MED ORDER — MORPHINE SULFATE (PF) 4 MG/ML IV SOLN: 4.0000 mg | Freq: Once | INTRAVENOUS | Status: DC

## 2022-07-25 MED ORDER — DICYCLOMINE HCL 20 MG PO TABS: 20.0000 mg | ORAL_TABLET | Freq: Two times a day (BID) | ORAL | 0 refills | Status: AC

## 2022-07-25 MED ORDER — MORPHINE SULFATE (PF) 4 MG/ML IV SOLN
8.0000 mg | Freq: Once | INTRAVENOUS | Status: AC
  Administered 2022-07-25: 8 mg via INTRAVENOUS
  Filled 2022-07-25: qty 2

## 2022-07-25 MED ORDER — MORPHINE SULFATE (PF) 4 MG/ML IV SOLN
4.0000 mg | Freq: Once | INTRAVENOUS | Status: DC
  Filled 2022-07-25: qty 1

## 2022-07-25 MED ORDER — KETOROLAC TROMETHAMINE 15 MG/ML IJ SOLN
15.0000 mg | Freq: Once | INTRAMUSCULAR | Status: AC
  Administered 2022-07-25: 15 mg via INTRAVENOUS
  Filled 2022-07-25: qty 1

## 2022-07-25 MED ORDER — FAMOTIDINE 20 MG PO TABS: 20.0000 mg | ORAL_TABLET | Freq: Two times a day (BID) | ORAL | 0 refills | Status: AC

## 2022-07-25 MED ORDER — IOHEXOL 300 MG/ML  SOLN
100.0000 mL | Freq: Once | INTRAMUSCULAR | Status: AC | PRN
  Administered 2022-07-25: 100 mL via INTRAVENOUS

## 2022-07-25 NOTE — Discharge Instructions (Addendum)
Return for any problem.  ?

## 2022-07-25 NOTE — ED Triage Notes (Signed)
LLQ pain x last night also c/o bilateral lower leg swelling.

## 2022-07-25 NOTE — ED Provider Notes (Signed)
[place] EMERGENC[facility] DEPARTMENT AT [facility] Provider Note   CSN: [id number] Arrival date & time: 07/25/22  1009     History Chief Complaint  Patient presents with   Abdominal Pain     Abdominal Pain  Colleen Pham is a 42 y.o. female presenting with left lower quadrant pain.   She reports 2 months history of Intermittent left lower quadrant pain, that radiates to her back.  Endorses nausea, denies vomiting and diarrhea.  Denies also recent trauma.  She is not a current smoker and only drinks on social occasions.  She also reports a 2-day history of lower extremity swelling, left > right.  Denies any history of blood clots.  Patient's recorded medical, surgical, social, medication list and allergies were reviewed in the Snapshot window as part of the initial history.   Review of Systems   Review of Systems  Gastrointestinal:  Positive for abdominal pain.    Physical Exam Updated Vital Signs BP (!) 161/95 (BP Location: Right Arm)   Pulse 73   Temp 97.9 F (36.6 C) (Oral)   Resp 20   Ht 5\' 2"  (1.575 m)   Wt 123 kg   LMP 10/05/2018   SpO2 100%   BMI 49.60 kg/m  Physical Exam General: Pleasant, no acute distress.   CV: RRR. No murmurs, rubs, or gallops. Left >right edema Pulmonary: Lungs CTAB. Normal effort. No wheezing or rales. Abdominal: Soft, tender in the left lower quadrant. Normal bowel sounds. Extremities: Palpable pulses. Normal ROM. Skin: Warm and dry. No obvious rash or lesions. Neuro: A&Ox3. Moves all extremities. Normal sensation. No focal deficit. Psych: Normal mood and affect   ED Course/ Medical Decision Making/ A&P    Procedures Procedures   Medications Ordered in ED Medications  morphine (PF) 4 MG/ML injection 4 mg (has no administration in time range)    Medical Decision Making:    Colleen Pham is a 42 y.o. female who presented to the ED today with abdominal pain detailed above.     Patient placed on continuous  vitals and telemetry monitoring while in ED which was reviewed periodically.   Complete initial physical exam performed, notably the patient  was stable. Left lower quadrant tender to palpation. No signs of peritonitis.      Reviewed and confirmed nursing documentation for past medical history, family history, social history.    Initial Assessment:   Possible differential diagnosis includes ovarian torsion versus ovarian cyst versus PID.  Alternatively, kidney stone is also on the differential due to the intermittent colicky nature of the pain.  Will need pelvic ultrasound to rule out ovarian torsion or cyst.  Will also check UA to rule out UTI versus a kidney stone . she will also need duplex ultrasound of the lower extremity to rule out a clot.  This is most consistent with an acute complicated illness  Initial Plan:  Screening labs including CBC and Metabolic panel to evaluate for infectious or metabolic etiology of disease.  Urinalysis with reflex culture ordered to evaluate for UTI or relevant urologic/nephrologic pathology.  Morphine for pain Pelvic ultrasound Duplex ultrasound of the lower extremity  Objective evaluation as below reviewed with plan for close reassessment  Initial Study Results:   Laboratory  All laboratory results reviewed without evidence of clinically relevant pathology.   Exceptions include: Hemoglobin 11.8   Radiology  All images reviewed independently. Agree with radiology report at this time.   VAS [place] LOWER EXTREMITY VENOUS (DVT) (7a-7p)  Result Date: 07/25/2022  Lower Venous DVT Study Patient Name:  Colleen Pham  Date of Exam:   07/25/2022 Medical Rec #: [id number]        Accession #:    [id number] Date of Birth: 1980/11/12         Patient Gender: F Patient Age:   82 years Exam Location:  [facility] Procedure:      VAS [place] LOWER EXTREMITY VENOUS (DVT) Referring Phys: Lorre Nick  --------------------------------------------------------------------------------  Indications: Edema.  Risk Factors: None identified. Limitations: Body habitus, poor ultrasound/tissue interface and patient pain tolerance. Comparison Study: No prior studies. Performing Technologist: Chanda Busing RVT  Examination Guidelines: A complete evaluation includes B-mode imaging, spectral Doppler, color Doppler, and power Doppler as needed of all accessible portions of each vessel. Bilateral testing is considered an integral part of a complete examination. Limited examinations for reoccurring indications may be performed as noted. The reflux portion of the exam is performed with the patient in reverse Trendelenburg.  +---------+---------------+---------+-----------+----------+--------------+ RIGHT    CompressibilityPhasicitySpontaneityPropertiesThrombus Aging +---------+---------------+---------+-----------+----------+--------------+ CFV      Full           Yes      Yes                                 +---------+---------------+---------+-----------+----------+--------------+ SFJ      Full                                                        +---------+---------------+---------+-----------+----------+--------------+ FV Prox  Full                                                        +---------+---------------+---------+-----------+----------+--------------+ FV Mid   Full                                                        +---------+---------------+---------+-----------+----------+--------------+ FV Distal               Yes      Yes                                 +---------+---------------+---------+-----------+----------+--------------+ PFV      Full                                                        +---------+---------------+---------+-----------+----------+--------------+ POP      Full           Yes      Yes                                  +---------+---------------+---------+-----------+----------+--------------+ PTV  Full                                                        +---------+---------------+---------+-----------+----------+--------------+ PERO     Full                                                        +---------+---------------+---------+-----------+----------+--------------+   +---------+---------------+---------+-----------+----------+--------------+ LEFT     CompressibilityPhasicitySpontaneityPropertiesThrombus Aging +---------+---------------+---------+-----------+----------+--------------+ CFV      Full           Yes      Yes                                 +---------+---------------+---------+-----------+----------+--------------+ SFJ      Full                                                        +---------+---------------+---------+-----------+----------+--------------+ FV Prox  Full                                                        +---------+---------------+---------+-----------+----------+--------------+ FV Mid   Full                                                        +---------+---------------+---------+-----------+----------+--------------+ FV Distal               Yes      Yes                                 +---------+---------------+---------+-----------+----------+--------------+ PFV      Full                                                        +---------+---------------+---------+-----------+----------+--------------+ POP      Full           Yes      Yes                                 +---------+---------------+---------+-----------+----------+--------------+ PTV      Full                                                        +---------+---------------+---------+-----------+----------+--------------+  PERO     Full                                                         +---------+---------------+---------+-----------+----------+--------------+     Summary: RIGHT: - There is no evidence of deep vein thrombosis in the lower extremity. However, portions of this examination were limited- see technologist comments above.  - No cystic structure found in the popliteal fossa.  LEFT: - There is no evidence of deep vein thrombosis in the lower extremity. However, portions of this examination were limited- see technologist comments above.  - No cystic structure found in the popliteal fossa.  *See table(s) above for measurements and observations. Electronically signed by Waverly Ferrari MD on 07/25/2022 at 1:14:17 PM.    Final       Reassessment and Plan:   Ultrasound of lower extremities shows no evidence of clots.  No evidence of ovarian torsion on transabdominal ultrasound.  Left ovary has a cyst measuring 3.7 x 2.3 x 2.5 cm. On reassessment, patient looks very uncomfortable.  She is still complaining of acute pain in the left lower quadrant.  Will get CT of the abdomen and pelvis with contrast to explore the abdomen further.   Clinical Impression:  1. Pelvic pain in female      Data Unavailable   Final Clinical Impression(s) / ED Diagnoses Final diagnoses:  Pelvic pain in female    Rx / DC Orders ED Discharge Orders     None         Laretta Bolster, MD 07/25/22 1940    Lorre Nick, MD 07/26/22 1434

## 2022-07-25 NOTE — ED Provider Notes (Signed)
I saw and evaluated the patient, reviewed the resident's note and I agree with the findings and plan.      Patient presents with worsening lower abdominal pain on the left side as well as bilateral lower extremity swelling.  Abdominal pain is been recurring and chronic.  On exam, she is tender to her left pelvis area.  No fever or chills no emesis.  Dopplers of lower extremities negative for DVT.  Will check pelvic ultrasound to rule out torsion as patient's pain has been colicky.  Will also check urinalysis to evaluate for UTI versus kidney stone   Lorre Nick, MD 07/25/22 1308

## 2022-07-25 NOTE — Progress Notes (Signed)
Bilateral lower extremity venous duplex has been completed. Preliminary results can be found in CV Proc through chart review.  Results were given to Dr. Freida Busman.  07/25/22 12:28 PM Olen Cordial RVT

## 2022-10-02 ENCOUNTER — Emergency Department (HOSPITAL_BASED_OUTPATIENT_CLINIC_OR_DEPARTMENT_OTHER): Payer: Medicaid Other | Admitting: Radiology

## 2022-10-02 ENCOUNTER — Emergency Department (HOSPITAL_BASED_OUTPATIENT_CLINIC_OR_DEPARTMENT_OTHER)
Admission: EM | Admit: 2022-10-02 | Discharge: 2022-10-02 | Disposition: A | Discharge status: Home / Self Care | Payer: Medicaid Other | Attending: Emergency Medicine | Admitting: Emergency Medicine

## 2022-10-02 ENCOUNTER — Encounter (HOSPITAL_BASED_OUTPATIENT_CLINIC_OR_DEPARTMENT_OTHER): Payer: Self-pay

## 2022-10-02 ENCOUNTER — Other Ambulatory Visit: Payer: Self-pay

## 2022-10-02 DIAGNOSIS — I10 Essential (primary) hypertension: Secondary | ICD-10-CM | POA: Insufficient documentation

## 2022-10-02 DIAGNOSIS — R519 Headache, unspecified: Secondary | ICD-10-CM | POA: Diagnosis present

## 2022-10-02 LAB — BASIC METABOLIC PANEL
Anion gap: 8 (ref 5–15)
BUN: 8 mg/dL (ref 6–20)
CO2: 25 mmol/L (ref 22–32)
Calcium: 8.6 mg/dL — ABNORMAL LOW (ref 8.9–10.3)
Chloride: 105 mmol/L (ref 98–111)
Creatinine, Ser: 0.75 mg/dL (ref 0.44–1.00)
GFR, Estimated: >60 mL/min (ref >60)
Glucose, Bld: 104 mg/dL — ABNORMAL HIGH (ref 70–99)
Potassium: 3.7 mmol/L (ref 3.5–5.1)
Sodium: 138 mmol/L (ref 135–145)

## 2022-10-02 LAB — CBC
HCT: 36.8 % (ref 36.0–46.0)
Hemoglobin: 11.5 g/dL — ABNORMAL LOW (ref 12.0–15.0)
MCH: 25.5 pg — ABNORMAL LOW (ref 26.0–34.0)
MCHC: 31.3 g/dL (ref 30.0–36.0)
MCV: 81.6 fL (ref 80.0–100.0)
Platelets: 304 10*3/uL (ref 150–400)
RBC: 4.51 MIL/uL (ref 3.87–5.11)
RDW: 16.3 % — ABNORMAL HIGH (ref 11.5–15.5)
WBC: 9.6 10*3/uL (ref 4.0–10.5)
nRBC: 0 % (ref 0.0–0.2)

## 2022-10-02 LAB — TROPONIN I (HIGH SENSITIVITY): Troponin I (High Sensitivity): 4 ng/L (ref <18)

## 2022-10-02 NOTE — ED Triage Notes (Addendum)
Patient arrives with complaints of worsening headache with heart fluttering. Patient reports having a worsening headache today at work. She checked her BP at work and it was in the 190's systolic. She was sent here for further work-up.  Reports some chest discomfort as well.

## 2022-10-02 NOTE — ED Provider Notes (Signed)
 [address] EMERGENCY DEPARTMENT AT [facility] Provider Note   CSN: [id number] Arrival date & time: 10/02/22  1828     History Chief Complaint  Patient presents with   Hypertension   Palpitations    Colleen Pham is a 42 y.o. female.  Patient presents to the emergency department concerns of hypertension and palpitations.  Reports that she began experiencing a headache with some heart fluttering earlier today.  States that the headache is worsening as she was checking her blood pressure and blood pressure Elevating.  States that she was having systolic readings at home of 190.  Is currently on amlodipine for blood pressure control and has been taking this medication as prescribed.  Denies any chest pain, shortness of breath, leg swelling, nausea or vomiting.  No prior history of any cardiac abnormalities beyond blood pressure.  No recent changes in medications patient has not take any stimulant medications or caffeine.   Hypertension  Palpitations      Home Medications Prior to Admission medications   Medication Sig Start Date End Date Taking? Authorizing Provider  acetaminophen (TYLENOL) 325 MG tablet Take 2 tablets (650 mg total) by mouth every 6 (six) hours as needed. Patient not taking: Reported on 11/28/2018 10/14/18   Dana Bing, MD  amLODipine (NORVASC) 10 MG tablet Take 1 tablet (10 mg total) by mouth daily. Patient not taking: Reported on 11/28/2018 10/14/18   [place] Bing, MD  dextromethorphan-guaiFENesin [facility] DM) 30-600 MG 12hr tablet Take 1 tablet by mouth 2 (two) times daily as needed for cough. Patient not taking: Reported on 10/31/2018 10/14/18   [place] Bing, MD  dicyclomine (BENTYL) 20 MG tablet Take 1 tablet (20 mg total) by mouth 2 (two) times daily. 07/25/22   Wynetta Fines, MD  famotidine (PEPCID) 20 MG tablet Take 1 tablet (20 mg total) by mouth 2 (two) times daily. 07/25/22   Wynetta Fines, MD  Heating Pad PADS 1 Units by Does  not apply route as needed. Patient not taking: Reported on 11/28/2018 10/14/18   [place] Bing, MD  mometasone-formoterol [facility]) 100-5 MCG/ACT AERO Inhale 2 puffs into the lungs 2 (two) times daily. 10/14/18   [place] Bing, MD  Oxycodone HCl 10 MG TABS Take 1-1.5 tablets (10-15 mg total) by mouth every 6 (six) hours as needed. Patient not taking: Reported on 11/28/2018 10/31/18   [place] Bing, MD  pantoprazole (PROTONIX) 40 MG tablet Take 1 tablet (40 mg total) by mouth daily. 10/14/18 11/13/18  [place] Bing, MD  prochlorperazine (COMPAZINE) 10 MG tablet Take 1 tablet (10 mg total) by mouth every 6 (six) hours as needed for nausea or vomiting. Patient not taking: Reported on 11/28/2018 10/14/18   [place] Bing, MD  simethicone (MYLICON) 80 MG chewable tablet Chew 1 tablet (80 mg total) by mouth 2 (two) times daily before lunch and supper for 14 days. 10/14/18 10/28/18  [place] Bing, MD      Allergies    Patient has no known allergies.    Review of Systems   Review of Systems  Cardiovascular:  Positive for palpitations.  All other systems reviewed and are negative.   Physical Exam Updated Vital Signs BP (!) 158/113   Pulse 71   Temp 98 F (36.7 C) (Temporal)   Resp 19   Ht 5\' 2"  (1.575 m)   Wt (!) 138.8 kg   LMP 10/05/2018   SpO2 97%   BMI 55.97 kg/m  Physical Exam Vitals and nursing note reviewed.  Constitutional:  General: She is not in acute distress.    Appearance: She is well-developed.  HENT:     Head: Normocephalic and atraumatic.  Eyes:     Conjunctiva/sclera: Conjunctivae normal.  Cardiovascular:     Rate and Rhythm: Normal rate and regular rhythm.     Heart sounds: No murmur heard. Pulmonary:     Effort: Pulmonary effort is normal. No respiratory distress.     Breath sounds: Normal breath sounds.  Abdominal:     Palpations: Abdomen is soft.     Tenderness: There is no abdominal tenderness.  Musculoskeletal:        General: No  swelling.     Cervical back: Neck supple.  Skin:    General: Skin is warm and dry.     Capillary Refill: Capillary refill takes less than 2 seconds.  Neurological:     Mental Status: She is alert.  Psychiatric:        Mood and Affect: Mood normal.     ED Results / Procedures / Treatments   Labs (all labs ordered are listed, but only abnormal results are displayed) Labs Reviewed  BASIC METABOLIC PANEL - Abnormal; Notable for the following components:      Result Value   Glucose, Bld 104 (*)    Calcium 8.6 (*)    All other components within normal limits  CBC - Abnormal; Notable for the following components:   Hemoglobin 11.5 (*)    MCH 25.5 (*)    RDW 16.3 (*)    All other components within normal limits  TROPONIN I (HIGH SENSITIVITY)    EKG None  Radiology DG Chest 2 View  Result Date: 10/02/2022 CLINICAL DATA:  Headache and chest discomfort. EXAM: CHEST - 2 VIEW COMPARISON:  None Available. FINDINGS: The heart size and mediastinal contours are within normal limits. There is no evidence of an acute infiltrate, pleural effusion or pneumothorax. Radiopaque surgical clips are seen within the right upper quadrant. The visualized skeletal structures are unremarkable. IMPRESSION: No active cardiopulmonary disease. Electronically Signed   By: Aram Candela M.D.   On: 10/02/2022 20:47    Procedures Procedures   Medications Ordered in ED Medications - No data to display  ED Course/ Medical Decision Making/ A&P                               Medical Decision Making Amount and/or Complexity of Data Reviewed Labs: ordered. Radiology: ordered.   This patient presents to the ED for concern of hypertension, palpitations.  Differential diagnosis includes ACS, hypertensive emergency, pneumonia, viral URI    Lab Tests:  I Ordered, and personally interpreted labs.  The pertinent results include: CBC is unremarkable, BMP unremarkable, troponin negative   Imaging Studies  ordered:  I ordered imaging studies including chest x-ray I independently visualized and interpreted imaging which showed no acute cardiopulmonary process I agree with the radiologist interpretation   Problem List / ED Course:  Patient presents to the emergency department with past medical history significant for anemia, and fibroids with concerns of hypertension and palpitations.  Reports that she had blood pressure checked this morning with pressures greater than 190.  States that she was having a mild headache at that time but denies any chest pain shortness of breath.  No nausea, vomiting, or diaphoresis.  No prior history of any cardiac abnormalities.  Basic labs collected for evaluation of symptoms including CBC, BMP and troponin.  Chest x-ray and EKG also ordered for evaluation of symptoms. CBC is unremarkable, BMP unremarkable, troponin negative.  EKG shows normal sinus rhythm, and chest x-ray with no acute cardiopulmonary process present.  Low suspicion for any cardiac cause to symptoms at this time.  Patient likely has hypertensive due to poorly managed control of blood pressures with current medication regimen. Discussed findings with patient and she feels reassured after patient's cuff was readjusted and moved to lower risk due to cuff not fitting on upper arm.  Blood pressure is now.  Somewhat more reliable at 155/88 and 158/113.  Given the patient does not have any acute findings indicating hypertensive emergency, low indication for further workup or admission at this time.  Instead advised patient that she should follow-up with primary care provider for blood pressure medication modification as needed.  At this time will not further adjust medications as patient is planning on following up with primary care provider in the next few days.  Discussed strict return precautions with patient.  I believe the patient is currently stable for discharge home. Patient discharged home in stable  condition.  Final Clinical Impression(s) / ED Diagnoses Final diagnoses:  Hypertension, unspecified type    Rx / DC Orders ED Discharge Orders     None         Smitty Knudsen, PA-C 10/02/22 2333    Royanne Foots, DO 10/03/22 1703

## 2022-10-02 NOTE — Discharge Instructions (Signed)
You are seen today in the emergency department concerns of hypertension and palpitations.  Your workup was thankfully reassuring.  It appears that your blood pressure is reading elevated due to the positioning of the blood pressure cuff and how you been checking at home.  After repositioning the cuff, your blood pressure is still slightly elevated but significantly improved.  I would recommend following up with her primary care provider to discuss possibly adjusting her blood pressure medications.  If you have any worsening symptoms, return the emergency department.

## 2022-12-05 ENCOUNTER — Other Ambulatory Visit: Payer: Self-pay

## 2022-12-05 MED ORDER — SEMAGLUTIDE-WEIGHT MANAGEMENT 0.5 MG/0.5ML ~~LOC~~ SOAJ
0.5000 mg | SUBCUTANEOUS | 0 refills | Status: AC
  Filled 2022-12-05: qty 2, 28d supply, fill #0

## 2023-01-11 ENCOUNTER — Other Ambulatory Visit: Payer: Self-pay

## 2023-01-11 MED ORDER — SEMAGLUTIDE-WEIGHT MANAGEMENT 1 MG/0.5ML ~~LOC~~ SOAJ
1.0000 mg | SUBCUTANEOUS | 0 refills | Status: AC
  Filled 2023-01-11: qty 2, 28d supply, fill #0

## 2023-11-20 ENCOUNTER — Other Ambulatory Visit: Payer: Self-pay | Admitting: Internal Medicine

## 2023-11-20 DIAGNOSIS — Z1231 Encounter for screening mammogram for malignant neoplasm of breast: Secondary | ICD-10-CM

## 2023-12-14 ENCOUNTER — Ambulatory Visit

## 2024-01-11 ENCOUNTER — Ambulatory Visit

## 2024-01-15 ENCOUNTER — Ambulatory Visit
Admission: RE | Admit: 2024-01-15 | Discharge: 2024-01-15 | Disposition: A | Discharge status: Home / Self Care | Source: Ambulatory Visit | Attending: Internal Medicine | Admitting: Internal Medicine

## 2024-01-15 DIAGNOSIS — Z1231 Encounter for screening mammogram for malignant neoplasm of breast: Secondary | ICD-10-CM
